# Patient Record
Sex: Female | Born: 1945 | Race: White | Hispanic: No | Marital: Single | Smoking: Never smoker
Health system: Southern US, Community
[De-identification: ages and names within clinical notes are randomized; demographics above are authoritative.]

## PROBLEM LIST (undated history)

## (undated) DIAGNOSIS — F32A Depression, unspecified: Secondary | ICD-10-CM

## (undated) DIAGNOSIS — H7190 Unspecified cholesteatoma, unspecified ear: Secondary | ICD-10-CM

## (undated) DIAGNOSIS — E669 Obesity, unspecified: Secondary | ICD-10-CM

## (undated) DIAGNOSIS — K2270 Barrett's esophagus without dysplasia: Secondary | ICD-10-CM

## (undated) DIAGNOSIS — T7840XA Allergy, unspecified, initial encounter: Secondary | ICD-10-CM

## (undated) DIAGNOSIS — K219 Gastro-esophageal reflux disease without esophagitis: Secondary | ICD-10-CM

## (undated) DIAGNOSIS — I10 Essential (primary) hypertension: Secondary | ICD-10-CM

## (undated) DIAGNOSIS — F909 Attention-deficit hyperactivity disorder, unspecified type: Secondary | ICD-10-CM

## (undated) DIAGNOSIS — F329 Major depressive disorder, single episode, unspecified: Secondary | ICD-10-CM

## (undated) DIAGNOSIS — E039 Hypothyroidism, unspecified: Secondary | ICD-10-CM

## (undated) HISTORY — DX: Major depressive disorder, single episode, unspecified: F32.9

## (undated) HISTORY — DX: Attention-deficit hyperactivity disorder, unspecified type: F90.9

## (undated) HISTORY — DX: Gastro-esophageal reflux disease without esophagitis: K21.9

## (undated) HISTORY — DX: Allergy, unspecified, initial encounter: T78.40XA

## (undated) HISTORY — PX: WRIST SURGERY: SHX841

## (undated) HISTORY — DX: Depression, unspecified: F32.A

## (undated) HISTORY — DX: Hypothyroidism, unspecified: E03.9

## (undated) HISTORY — DX: Essential (primary) hypertension: I10

## (undated) HISTORY — DX: Obesity, unspecified: E66.9

## (undated) HISTORY — DX: Barrett's esophagus without dysplasia: K22.70

## (undated) HISTORY — DX: Unspecified cholesteatoma, unspecified ear: H71.90

---

## 1988-06-27 HISTORY — PX: TYMPANIC MEMBRANE REPAIR: SHX294

## 1999-02-17 ENCOUNTER — Other Ambulatory Visit: Admission: RE | Admit: 1999-02-17 | Discharge: 1999-02-17 | Payer: Self-pay | Admitting: Obstetrics and Gynecology

## 1999-06-10 ENCOUNTER — Encounter: Admission: RE | Admit: 1999-06-10 | Discharge: 1999-06-10 | Payer: Self-pay | Admitting: Obstetrics and Gynecology

## 1999-06-10 ENCOUNTER — Encounter: Payer: Self-pay | Admitting: Obstetrics and Gynecology

## 2000-02-23 ENCOUNTER — Other Ambulatory Visit: Admission: RE | Admit: 2000-02-23 | Discharge: 2000-02-23 | Payer: Self-pay | Admitting: Obstetrics and Gynecology

## 2000-08-15 ENCOUNTER — Encounter: Payer: Self-pay | Admitting: Obstetrics and Gynecology

## 2000-08-15 ENCOUNTER — Encounter: Admission: RE | Admit: 2000-08-15 | Discharge: 2000-08-15 | Payer: Self-pay | Admitting: Obstetrics and Gynecology

## 2001-01-04 ENCOUNTER — Encounter: Admission: RE | Admit: 2001-01-04 | Discharge: 2001-01-04 | Payer: Self-pay | Admitting: Internal Medicine

## 2001-01-04 ENCOUNTER — Encounter: Payer: Self-pay | Admitting: Internal Medicine

## 2001-02-23 ENCOUNTER — Other Ambulatory Visit: Admission: RE | Admit: 2001-02-23 | Discharge: 2001-02-23 | Payer: Self-pay | Admitting: Internal Medicine

## 2001-05-30 ENCOUNTER — Encounter: Admission: RE | Admit: 2001-05-30 | Discharge: 2001-05-30 | Payer: Self-pay | Admitting: Internal Medicine

## 2001-05-30 ENCOUNTER — Encounter: Payer: Self-pay | Admitting: Internal Medicine

## 2002-03-18 ENCOUNTER — Encounter: Admission: RE | Admit: 2002-03-18 | Discharge: 2002-03-18 | Payer: Self-pay | Admitting: Obstetrics and Gynecology

## 2002-03-18 ENCOUNTER — Encounter: Payer: Self-pay | Admitting: Obstetrics and Gynecology

## 2003-05-08 ENCOUNTER — Ambulatory Visit (HOSPITAL_COMMUNITY): Admission: RE | Admit: 2003-05-08 | Discharge: 2003-05-08 | Payer: Self-pay | Admitting: Orthopedic Surgery

## 2003-05-08 ENCOUNTER — Ambulatory Visit (HOSPITAL_BASED_OUTPATIENT_CLINIC_OR_DEPARTMENT_OTHER): Admission: RE | Admit: 2003-05-08 | Discharge: 2003-05-08 | Payer: Self-pay | Admitting: Orthopedic Surgery

## 2004-01-02 ENCOUNTER — Encounter: Admission: RE | Admit: 2004-01-02 | Discharge: 2004-01-02 | Payer: Self-pay | Admitting: Obstetrics and Gynecology

## 2007-08-07 ENCOUNTER — Encounter: Admission: RE | Admit: 2007-08-07 | Discharge: 2007-08-07 | Payer: Self-pay | Admitting: Family Medicine

## 2007-08-24 ENCOUNTER — Encounter: Admission: RE | Admit: 2007-08-24 | Discharge: 2007-08-24 | Payer: Self-pay | Admitting: Family Medicine

## 2009-08-18 ENCOUNTER — Encounter: Admission: RE | Admit: 2009-08-18 | Discharge: 2009-08-18 | Payer: Self-pay | Admitting: Internal Medicine

## 2010-11-12 NOTE — Op Note (Signed)
NAME:  Christina Hays, Christina Hays                       ACCOUNT NO.:  [id number]   MEDICAL RECORD NO.:  [id number]                   PATIENT TYPE:  AMB   LOCATION:  DSC                                  FACILITY:  [facility]   PHYSICIAN:  Katy Fitch. Naaman Plummer., M.D.          DATE OF BIRTH:  [birth year]   DATE OF PROCEDURE:  05/08/2003  DATE OF DISCHARGE:                                 OPERATIVE REPORT   PREOPERATIVE DIAGNOSIS:  Enlarging mass, left long finger A2 pulley  consistent with a probable ganglion cyst.   POSTOPERATIVE DIAGNOSIS:  Enlarging mass, left long finger A2 pulley  consistent with a probable ganglion cyst.   PROCEDURE:  Excision of a large 12.0 x 11.0 mm ganglion from palmar surface  of A1/A2 pulley junction, left long finger.   SURGEON:  Katy Fitch. Sypher, M.D.   ASSISTANT:  Jonni Sanger, P.A.   ANESTHESIA:  2% lidocaine metacarpal head level block supplemented by IV  sedation.   SUPERVISING ANESTHESIOLOGIST:  Sheldon Silvan, M.D.   INDICATIONS FOR PROCEDURE:  This is a well established patient who presented  for evaluation of a painful lump in her left palm overlying the A2 pulley of  the long finger.  Clinical examination suggested a flexor sheath ganglion.  This was causing her mechanical symptoms.  She requested resection.   PROCEDURE:  The patient was brought to the operating room and placed in the  supine position on the operating table.  Following light sedation, the left  arm was prepped with Betadine soap and solution and sterilely draped.  A  pneumatic tourniquet was applied to the proximal forearm.  Following  exsanguination of the limb with an Esmarch bandage, the arterial tourniquet  was inflated to 250 mmHg.  2% lidocaine was then infiltrated into the path  of the intended incision and flexor sheath. When anesthesia was  satisfactory, a short oblique incision was fashioned directly over the mass.  Careful dissection protected the neurovascular bundles.   Blunt retractors  were placed revealing a large ganglion.  This was drained of its contents  and excised off the sheath.  The origin of the cyst appeared to be the  junction between the A1 and A2 pulleys. The synovium at this level was  debrided and electrocauterized.   The wound was then repaired with interrupted sutures of 5-0 nylon.   A compressive dressing was applied with Xeroflo, sterile gauze and an ace  wrap.   There were no apparent complications.   For after care, the patient will return to our office for follow-up in a  week for suture removal.   She is given a prescription for Darvocet-N 100 one or two tablets p.o. q.4  to 6h p.r.n. pain, twenty tablets without refill.   She will return sooner p.r.n. problems.  Katy Fitch Naaman Plummer., M.D.    RVS/MEDQ  D:  05/08/2003  T:  05/08/2003  Job:  [id number]   cc:   Erskine Speed, M.D.  [address]  [place]  [place] [address]  Fax: [phone]

## 2011-01-10 ENCOUNTER — Other Ambulatory Visit: Payer: Self-pay | Admitting: Gastroenterology

## 2011-01-11 ENCOUNTER — Ambulatory Visit
Admission: RE | Admit: 2011-01-11 | Discharge: 2011-01-11 | Disposition: A | Discharge status: Home / Self Care | Payer: BC Managed Care – PPO | Source: Ambulatory Visit | Attending: Gastroenterology | Admitting: Gastroenterology

## 2011-01-26 HISTORY — PX: MIDDLE EAR SURGERY: SHX713

## 2011-06-01 ENCOUNTER — Encounter (INDEPENDENT_AMBULATORY_CARE_PROVIDER_SITE_OTHER): Payer: BC Managed Care – PPO | Admitting: Internal Medicine

## 2011-06-01 DIAGNOSIS — R609 Edema, unspecified: Secondary | ICD-10-CM

## 2011-06-01 DIAGNOSIS — Z0000 Encounter for general adult medical examination without abnormal findings: Secondary | ICD-10-CM

## 2011-06-01 DIAGNOSIS — E7800 Pure hypercholesterolemia, unspecified: Secondary | ICD-10-CM

## 2011-06-24 ENCOUNTER — Other Ambulatory Visit: Payer: Self-pay | Admitting: Internal Medicine

## 2011-06-24 DIAGNOSIS — E042 Nontoxic multinodular goiter: Secondary | ICD-10-CM

## 2011-06-27 ENCOUNTER — Other Ambulatory Visit: Payer: Self-pay | Admitting: Internal Medicine

## 2011-06-27 DIAGNOSIS — E042 Nontoxic multinodular goiter: Secondary | ICD-10-CM

## 2011-06-29 ENCOUNTER — Other Ambulatory Visit (HOSPITAL_COMMUNITY): Payer: BC Managed Care – PPO

## 2011-06-30 ENCOUNTER — Ambulatory Visit
Admission: RE | Admit: 2011-06-30 | Discharge: 2011-06-30 | Disposition: A | Discharge status: Home / Self Care | Payer: BC Managed Care – PPO | Source: Ambulatory Visit | Attending: Internal Medicine | Admitting: Internal Medicine

## 2011-06-30 DIAGNOSIS — E042 Nontoxic multinodular goiter: Secondary | ICD-10-CM

## 2011-07-27 ENCOUNTER — Encounter: Payer: Self-pay | Admitting: Internal Medicine

## 2011-07-27 ENCOUNTER — Ambulatory Visit (INDEPENDENT_AMBULATORY_CARE_PROVIDER_SITE_OTHER): Payer: BC Managed Care – PPO | Admitting: Internal Medicine

## 2011-07-27 DIAGNOSIS — R1115 Cyclical vomiting syndrome unrelated to migraine: Secondary | ICD-10-CM

## 2011-07-27 DIAGNOSIS — F909 Attention-deficit hyperactivity disorder, unspecified type: Secondary | ICD-10-CM

## 2011-07-27 DIAGNOSIS — F411 Generalized anxiety disorder: Secondary | ICD-10-CM

## 2011-07-27 DIAGNOSIS — E039 Hypothyroidism, unspecified: Secondary | ICD-10-CM

## 2011-07-27 DIAGNOSIS — I10 Essential (primary) hypertension: Secondary | ICD-10-CM

## 2011-07-27 DIAGNOSIS — R4589 Other symptoms and signs involving emotional state: Secondary | ICD-10-CM

## 2011-07-27 LAB — CBC WITH DIFFERENTIAL/PLATELET
Basophils Absolute: 0.1 10*3/uL (ref 0.0–0.1)
Basophils Relative: 1 % (ref 0–1)
Eosinophils Absolute: 0.3 10*3/uL (ref 0.0–0.7)
Hemoglobin: 13.8 g/dL (ref 12.0–15.0)
MCHC: 34 g/dL (ref 30.0–36.0)
Monocytes Relative: 6 % (ref 3–12)
Neutro Abs: 5.5 10*3/uL (ref 1.7–7.7)
Neutrophils Relative %: 64 % (ref 43–77)
Platelets: 330 10*3/uL (ref 150–400)

## 2011-07-27 NOTE — Progress Notes (Signed)
Subjective:    Patient ID: Christina Hays, female    DOB: 06/13/1946, 66 y.o.   MRN: [id number]  Hypertension This is a chronic problem. The current episode started more than 1 year ago. The problem is unchanged. The problem is controlled. Associated symptoms include anxiety and peripheral edema. Pertinent negatives include no chest pain, palpitations or shortness of breath. Agents associated with hypertension include amphetamines. Risk factors for coronary artery disease include dyslipidemia, obesity, post-menopausal state, sedentary lifestyle and stress. The current treatment provides moderate improvement. Compliance problems include diet and exercise.  none. Identifiable causes of hypertension include sleep apnea.  Thyroid Problem Symptoms include anxiety, diarrhea, leg swelling and weight gain. Patient reports no cold intolerance, depressed mood, fatigue, heat intolerance, palpitations or weight loss. The symptoms have been stable.  Emesis  This is a recurrent problem. The current episode started more than 1 year ago. The problem occurs intermittently. The problem has been gradually worsening. The emesis has an appearance of stomach contents. There has been no fever. Associated symptoms include abdominal pain, chills and diarrhea. Pertinent negatives include no chest pain, coughing, dizziness or weight loss. She has tried diet change for the symptoms. The treatment provided no relief.   In January of 2011 she had her first episodes. These occur at random intervals without known precipitating causes. She begins with burping and progresses to distention with vomiting and occasionally diarrhea. The episodes last for 4-8 hours and has been occurring 3-4 times a month in recent months. She was evaluated by Dr. Evette Cristal with a normal abdominal ultrasound. She had to put off colonoscopy and endoscopy for her recent ear surgery. She is unhappy dealing with the [facility] office staff and wants to change practices.  Her last colonoscopy was by Dr. Arlyce Dice and she wishes to return to him.  She had a multinodular goiter in 2002 which was biopsied and felt to be normal. Her recent ultrasound revealed that the dominant nodule had not changed in size or consistency, and it is felt safe to follow at this point. She continues to be hypothyroid and that will be monitored.  She continues to take Adderall for her attention deficit disorder. This is not exacerbating her hypertension. It provides her with good support at work without side effects.   Review of Systems  Constitutional: Positive for chills and weight gain. Negative for weight loss and fatigue.  Respiratory: Negative for cough and shortness of breath.   Cardiovascular: Negative for chest pain and palpitations.  Gastrointestinal: Positive for abdominal pain and diarrhea.  Neurological: Negative for dizziness.  Hematological: Negative for cold intolerance and heat intolerance.       Objective:   Physical Exam  Constitutional: She is oriented to person, place, and time. She appears well-developed.  Eyes: Pupils are equal, round, and reactive to light.  Neck: Normal range of motion. Neck supple. No thyromegaly present.  Cardiovascular: Normal rate, regular rhythm and normal heart sounds.   Abdominal: She exhibits no mass. There is no tenderness.  Musculoskeletal: She exhibits edema.  Lymphadenopathy:    She has no cervical adenopathy.  Neurological: She is alert and oriented to person, place, and time.          Assessment & Plan:  Problem #1 cyclic vomiting syndrome. This needs to be completely evaluated by GI. Family medical leave act papers were filled out today to prevent her from running into trouble at work. There no further medicines to try at this point. She will be referred to  Dr. Arlyce Dice  Problem #2 hypertension-no change  Problem #3 hypothyroid-lab rechecked today  Problem #4 ADD-meds refilled today or as needed when she runs  out.  Problem #5 obesity-her lipid profile will be rechecked today and she is can encouraged to continue losing weight.  Problem #6 sleep apnea-this is no longer a problem and she has corrected some lifestyle issues.  Problem #7 anxiety-she has this as an after thought today because she is beginning to experience a lot of stress at work. She is even had one panic attack in the last month which she handles by deep breathing. This has not yet affecting her sleep or her social life. For the most part she enjoys her work.  Problem #8 hearing loss-as noted in her last office visit her recent surgery by Dr. Joelene Millin on the left ear gave her a 40 dB improvement. She is very happy with his outcome.  Her outstanding issues include no GYN exam in 10 years (Dr. Oletha Blend) Her history of migraines has not been active for many years.

## 2011-07-28 LAB — T4, FREE: Free T4: 1.5 ng/dL (ref 0.80–1.80)

## 2011-07-28 LAB — COMPREHENSIVE METABOLIC PANEL
ALT: 20 U/L (ref 0–35)
AST: 21 U/L (ref 0–37)
Alkaline Phosphatase: 89 U/L (ref 39–117)
BUN: 11 mg/dL (ref 6–23)
Chloride: 100 mEq/L (ref 96–112)
Creat: 0.79 mg/dL (ref 0.50–1.10)
Total Bilirubin: 0.6 mg/dL (ref 0.3–1.2)

## 2011-07-28 LAB — LIPID PANEL
HDL: 64 mg/dL (ref >39)
LDL Cholesterol: 130 mg/dL — ABNORMAL HIGH (ref 0–99)
Total CHOL/HDL Ratio: 3.3 Ratio
VLDL: 17 mg/dL (ref 0–40)

## 2011-07-28 LAB — TSH: TSH: 3.364 u[IU]/mL (ref 0.350–4.500)

## 2011-08-01 ENCOUNTER — Encounter: Payer: Self-pay | Admitting: Internal Medicine

## 2011-09-02 ENCOUNTER — Encounter: Payer: Self-pay | Admitting: Gastroenterology

## 2011-09-04 ENCOUNTER — Other Ambulatory Visit: Payer: Self-pay | Admitting: Internal Medicine

## 2011-09-11 ENCOUNTER — Ambulatory Visit (INDEPENDENT_AMBULATORY_CARE_PROVIDER_SITE_OTHER): Payer: BC Managed Care – PPO | Admitting: Emergency Medicine

## 2011-09-11 VITALS — BP 128/77 | HR 82 | Temp 98.6°F | Resp 16 | Ht 63.0 in | Wt 253.0 lb

## 2011-09-11 DIAGNOSIS — M7090 Unspecified soft tissue disorder related to use, overuse and pressure of unspecified site: Secondary | ICD-10-CM

## 2011-09-11 DIAGNOSIS — I10 Essential (primary) hypertension: Secondary | ICD-10-CM

## 2011-09-11 DIAGNOSIS — F988 Other specified behavioral and emotional disorders with onset usually occurring in childhood and adolescence: Secondary | ICD-10-CM

## 2011-09-11 MED ORDER — HYDROCHLOROTHIAZIDE 25 MG PO TABS: 25.0000 mg | ORAL_TABLET | Freq: Every day | ORAL | Status: DC

## 2011-09-11 MED ORDER — AMPHETAMINE-DEXTROAMPHETAMINE 20 MG PO TABS: 20.0000 mg | ORAL_TABLET | Freq: Two times a day (BID) | ORAL | Status: DC

## 2011-09-11 MED ORDER — LOSARTAN POTASSIUM 50 MG PO TABS: 50.0000 mg | ORAL_TABLET | Freq: Every day | ORAL | Status: DC

## 2011-09-11 NOTE — Progress Notes (Signed)
  Subjective:    Patient ID: Christina Hays, female    DOB: 1945/10/05, 66 y.o.   MRN: [id number]  HPI patient presents for refill on all her medications. She saw Dr. Merla Riches the end of January and had a complete blood panel as well as thyroid tests lipid profile and cmet. She feels well. She also is on Adderall twice a day that usually just takes it once a day in the morning, she is a long drive..    Review of Systems specifically she denies any chest pain shortness of breath. She feels fine she's been trying to be active and stay on her diet.     Objective:   Physical Exam  Constitutional: She appears well-developed.  HENT:  Head: Normocephalic.  Eyes: Pupils are equal, round, and reactive to light.  Neck: No JVD present. No tracheal deviation present. No thyromegaly present.  Cardiovascular: Normal rate.  Exam reveals no gallop and no friction rub.   No murmur heard. Pulmonary/Chest: No respiratory distress. She has no wheezes. She has no rales.  Lymphadenopathy:    She has no cervical adenopathy.          Assessment & Plan:  Assessment is hypertension and thyroid disease. She also has ADD on medication to

## 2011-09-20 ENCOUNTER — Encounter: Payer: Self-pay | Admitting: Internal Medicine

## 2011-09-21 ENCOUNTER — Encounter: Payer: Self-pay | Admitting: Gastroenterology

## 2011-09-21 ENCOUNTER — Ambulatory Visit (INDEPENDENT_AMBULATORY_CARE_PROVIDER_SITE_OTHER): Payer: BC Managed Care – PPO | Admitting: Gastroenterology

## 2011-09-21 ENCOUNTER — Other Ambulatory Visit (INDEPENDENT_AMBULATORY_CARE_PROVIDER_SITE_OTHER): Payer: BC Managed Care – PPO

## 2011-09-21 DIAGNOSIS — R112 Nausea with vomiting, unspecified: Secondary | ICD-10-CM

## 2011-09-21 DIAGNOSIS — R109 Unspecified abdominal pain: Secondary | ICD-10-CM

## 2011-09-21 DIAGNOSIS — R1115 Cyclical vomiting syndrome unrelated to migraine: Secondary | ICD-10-CM

## 2011-09-21 LAB — BASIC METABOLIC PANEL
Calcium: 9.2 mg/dL (ref 8.4–10.5)
GFR: 73.29 mL/min (ref >60.00)
Potassium: 4 mEq/L (ref 3.5–5.1)
Sodium: 140 mEq/L (ref 135–145)

## 2011-09-21 NOTE — Assessment & Plan Note (Addendum)
There is no clear etiology for his symptoms which include diarrhea. Partial, intermittent bowel obstruction is a possibility. There is no pattern with food or drink intake to suggest a food allergy. Gastroparesis, ulcer and non ulcer dyspepsia are less likely.  Recommendations #1 CT of the abdomen and pelvis #2 upper endoscopy if #1 is negative. #3 gastric imaging scan pending results of above #4 to consider hyomax sublingual with the onset of symptoms #4 stat evaluation while patient is symptomatic

## 2011-09-21 NOTE — Progress Notes (Signed)
History of Present Illness:  Christina Hays is a 66 year old white female referred at the request of Dr. Merla Riches for evaluation of episodic abdominal pain, vomiting and diarrhea. For the past 2 years she suffered from episodes as described above. She initially   was awakened at night with diffuse pain across her mid abdomen. This would be followed by nausea, vomiting and protracted diarrhea. Episodes would last 2-3 hours at a time. She initially had 3 episodes in 5 weeks. Over the next 2 years she's had about 5 episodes a year. In between she feels perfectly well. There is no relation to any specific foods. She now tends to get these episodes during the day. She vomits clear liquids rather than undigested foods. Symptoms are preceded by excess foul-smelling belches. There's been no change in her medications or diet.    Past Medical History  Diagnosis Date  . Hypertension   . Hypothyroidism   . ADD (attention deficit disorder with hyperactivity)   . Hearing loss   . Obesity   . Depression   . Migraine   . Cholesteatoma of ear    Past Surgical History  Procedure Date  . Tympanic membrane repair 1990    tighting  . Middle ear surgery 01/2011    replaced missing malleus   family history includes Colon cancer in her maternal grandmother; Colon polyps in her mother; Emphysema in her father and mother; and Liver cancer in her maternal grandfather. Current Outpatient Prescriptions  Medication Sig Dispense Refill  . amphetamine-dextroamphetamine (ADDERALL) 20 MG tablet Take 1 tablet (20 mg total) by mouth 2 (two) times daily.  60 tablet  0  . Cholecalciferol (VITAMIN D3) 2000 UNITS capsule Take 6,000 Units by mouth daily.      . hydrochlorothiazide (HYDRODIURIL) 25 MG tablet Take 1 tablet (25 mg total) by mouth daily.  30 tablet  11  . levothyroxine (SYNTHROID, LEVOTHROID) 88 MCG tablet Take 88 mcg by mouth daily.       Marland Kitchen losartan (COZAAR) 50 MG tablet Take 1 tablet (50 mg total) by mouth daily.   30 tablet  11  . Multiple Vitamins-Minerals (EQL PROTECTAVISION) CAPS Take 1 tablet by mouth daily.      . Omega-3 Fatty Acids 300 MG CAPS Take 1 capsule by mouth daily.      . vitamin B-12 (CYANOCOBALAMIN) 1000 MCG tablet Take 1,000 mcg by mouth daily.       Allergies as of 09/21/2011 - Review Complete 09/21/2011  Allergen Reaction Noted  . Celexa  07/27/2011  . Codeine  07/27/2011  . Penicillins  07/27/2011    reports that she has never smoked. She has never used smokeless tobacco. She reports that she drinks alcohol. She reports that she does not use illicit drugs.     Review of Systems: She's had some loss of hearing which is improved since undergoing ear surgery. She complains of frequent lower extremity edema. Pertinent positive and negative review of systems were noted in the above HPI section. All other review of systems were otherwise negative.  Vital signs were reviewed in today's medical record Physical Exam: General: Well developed , well nourished, no acute distress Head: Normocephalic and atraumatic Eyes:  sclerae anicteric, EOMI Ears: Normal auditory acuity Mouth: No deformity or lesions Neck: Supple, no masses or thyromegaly Lungs: Clear throughout to auscultation Heart: Regular rate and rhythm; no murmurs, rubs or bruits Abdomen: Soft, non tender and non distended. No masses, hepatosplenomegaly or hernias noted. Normal Bowel sounds; there is no  succussion splash Rectal:deferred Musculoskeletal: Symmetrical with no gross deformities  Skin: No lesions on visible extremities Pulses:  Normal pulses noted Extremities: No clubbing, cyanosis, or deformities noted; there is bilateral pedal edema, left greater than right Neurological: Alert oriented x 4, grossly nonfocal Cervical Nodes:  No significant cervical adenopathy Inguinal Nodes: No significant inguinal adenopathy Psychological:  Alert and cooperative. Normal mood and affect

## 2011-09-21 NOTE — Patient Instructions (Signed)
  You have been scheduled for a CT scan of the abdomen and pelvis at [facility] ([address]---this is in the same building as [facility]).   You are scheduled on 09/26/2011 at 11am. You should arrive 15 minutes prior to your appointment time for registration. Please follow the written instructions below on the day of your exam:  WARNING: IF YOU ARE ALLERGIC TO IODINE/X-RAY DYE, PLEASE NOTIFY RADIOLOGY IMMEDIATELY AT [phone]! YOU WILL BE GIVEN A 13 HOUR PREMEDICATION PREP.  1) Do not eat or drink anything after 7am (4 hours prior to your test) 2) You have been given 2 bottles of oral contrast to drink. The solution may taste               better if refrigerated, but do NOT add ice or any other liquid to this solution. Shake             well before drinking.    Drink 1 bottle of contrast @ 9am (2 hours prior to your exam)  Drink 1 bottle of contrast @ 10am (1 hour prior to your exam)  You may take any medications as prescribed with a small amount of water except for the following: Metformin, Glucophage, Glucovance, Avandamet, Riomet, Fortamet, Actoplus Met, Janumet, Glumetza or Metaglip. The above medications must be held the day of the exam AND 48 hours after the exam.  The purpose of you drinking the oral contrast is to aid in the visualization of your intestinal tract. The contrast solution may cause some diarrhea. Before your exam is started, you will be given a small amount of fluid to drink. Depending on your individual set of symptoms, you may also receive an intravenous injection of x-ray contrast/dye. Plan on being at [facility] for 30 minutes or long, depending on the type of exam you are having performed.  If you have any questions regarding your exam or if you need to reschedule, you may call the CT department at [phone] between the hours of 8:00 am and 5:00 pm, Monday-Friday.   GO TO THE BASEMENT FOR LABS  TODAY ________________________________________________________________________

## 2011-09-26 ENCOUNTER — Other Ambulatory Visit: Payer: BC Managed Care – PPO

## 2011-09-29 ENCOUNTER — Ambulatory Visit (INDEPENDENT_AMBULATORY_CARE_PROVIDER_SITE_OTHER)
Admission: RE | Admit: 2011-09-29 | Discharge: 2011-09-29 | Disposition: A | Discharge status: Home / Self Care | Payer: BC Managed Care – PPO | Source: Ambulatory Visit | Attending: Gastroenterology | Admitting: Gastroenterology

## 2011-09-29 DIAGNOSIS — R112 Nausea with vomiting, unspecified: Secondary | ICD-10-CM

## 2011-09-29 DIAGNOSIS — R109 Unspecified abdominal pain: Secondary | ICD-10-CM

## 2011-09-29 MED ORDER — IOHEXOL 300 MG/ML  SOLN
80.0000 mL | Freq: Once | INTRAMUSCULAR | Status: AC | PRN
  Administered 2011-09-29: 80 mL via INTRAVENOUS

## 2011-10-13 ENCOUNTER — Ambulatory Visit (AMBULATORY_SURGERY_CENTER): Payer: BC Managed Care – PPO | Admitting: *Deleted

## 2011-10-13 VITALS — Ht 62.0 in | Wt 257.8 lb

## 2011-10-13 DIAGNOSIS — R1115 Cyclical vomiting syndrome unrelated to migraine: Secondary | ICD-10-CM

## 2011-10-13 DIAGNOSIS — R109 Unspecified abdominal pain: Secondary | ICD-10-CM

## 2011-10-13 DIAGNOSIS — R112 Nausea with vomiting, unspecified: Secondary | ICD-10-CM

## 2011-10-14 ENCOUNTER — Encounter: Payer: Self-pay | Admitting: Gastroenterology

## 2011-10-20 ENCOUNTER — Telehealth: Payer: Self-pay | Admitting: Gastroenterology

## 2011-10-20 NOTE — Telephone Encounter (Signed)
Patient wants to know if she has to remove her acrylic overlay nails prior to EGD. Per Olegario Messier in previsit- no, just no red or dark polish. Patient notified

## 2011-10-20 NOTE — Telephone Encounter (Signed)
Patient wants Dr. Arlyce Dice to know that she has met her insurance deductible until June 1st then it will start over. She states if she needs anything after the EGD on 10/25/11, she would like it to be done prior to June 1st so she will not have to meet her deductible again.

## 2011-10-20 NOTE — Telephone Encounter (Signed)
ok 

## 2011-10-25 ENCOUNTER — Telehealth: Payer: Self-pay | Admitting: Gastroenterology

## 2011-10-25 ENCOUNTER — Encounter: Payer: BC Managed Care – PPO | Admitting: Gastroenterology

## 2011-10-25 NOTE — Telephone Encounter (Signed)
What was she scheduled for?

## 2011-10-25 NOTE — Telephone Encounter (Signed)
Pt was schedule for an EGD this afternoon

## 2011-10-25 NOTE — Telephone Encounter (Signed)
The cough is not a contraindication for having an EGD and I would recommend she have it done.  If she cancels I would not assess a penalty.

## 2011-11-08 ENCOUNTER — Ambulatory Visit (AMBULATORY_SURGERY_CENTER): Payer: BC Managed Care – PPO | Admitting: Gastroenterology

## 2011-11-08 ENCOUNTER — Encounter: Payer: Self-pay | Admitting: Gastroenterology

## 2011-11-08 ENCOUNTER — Other Ambulatory Visit: Payer: Self-pay | Admitting: Gastroenterology

## 2011-11-08 VITALS — BP 146/82 | HR 90 | Temp 96.5°F | Resp 14 | Ht 62.0 in | Wt 257.0 lb

## 2011-11-08 DIAGNOSIS — R109 Unspecified abdominal pain: Secondary | ICD-10-CM

## 2011-11-08 DIAGNOSIS — K2270 Barrett's esophagus without dysplasia: Secondary | ICD-10-CM

## 2011-11-08 DIAGNOSIS — R1115 Cyclical vomiting syndrome unrelated to migraine: Secondary | ICD-10-CM

## 2011-11-08 DIAGNOSIS — R112 Nausea with vomiting, unspecified: Secondary | ICD-10-CM

## 2011-11-08 MED ORDER — PANTOPRAZOLE SODIUM 40 MG PO TBEC: 40.0000 mg | DELAYED_RELEASE_TABLET | Freq: Two times a day (BID) | ORAL | Status: DC

## 2011-11-08 MED ORDER — SODIUM CHLORIDE 0.9 % IV SOLN: 500.0000 mL | INTRAVENOUS | Status: DC

## 2011-11-08 NOTE — Op Note (Signed)
[facility] [address] [place], [place]  [address]  ENDOSCOPY PROCEDURE REPORT  PATIENT:  Christina, Hays  MR#:  [id number] BIRTHDATE:  [birth year], 66 yrs. old  GENDER:  female  ENDOSCOPIST:  Barbette Hair. Arlyce Dice, MD Referred by:  Ellamae Sia, M.D.  PROCEDURE DATE:  11/08/2011 PROCEDURE:  EGD with biopsy, [id number] ASA CLASS:  Class II INDICATIONS:  nausea and vomiting  MEDICATIONS:   MAC sedation, administered by CRNA propofol 100mg IV, glycopyrrolate (Robinal) 0.2 mg IV, 0.6cc simethancone 0.6 cc PO TOPICAL ANESTHETIC:  DESCRIPTION OF PROCEDURE:   After the risks and benefits of the procedure were explained, informed consent was obtained.  The LB GIF-H180 [id number] endoscope was introduced through the mouth and advanced to the third portion of the duodenum.  The instrument was slowly withdrawn as the mucosa was fully examined. <<PROCEDUREIMAGES>>  Esophagitis was found in the mid esophagus. Erosive esophagitis with friable mucosa at GE junction (see image8).  irregular Z-line. Irregular Z line. Z line at 30cm. Bxs taken (see image7). A stricture was found at the gastroesophageal junction (see image4). Moderate stricture  A hiatal hernia was found. 3-4cm sliding hiatal hernia  Otherwise the examination was normal (see image2 and image3).    Retroflexed views revealed no abnormalities.    The scope was then withdrawn from the patient and the procedure completed.  COMPLICATIONS:  None  ENDOSCOPIC IMPRESSION: 1) Esophagitis in the mid esophagus 2) Irregular Z-line 3) Stricture at the gastroesophageal junction 4) Hiatal hernia 5) Otherwise normal examination  Findings do not explain symptoms of episodic nausea and vomiting  RECOMMENDATIONS:begin protonix 40mg  bid gastric emptying scan OV 3-4 weeks  ______________________________ Barbette Hair. Arlyce Dice, MD  CC:  n. eSIGNED:   Barbette Hair. Roshanda Balazs at 11/08/2011 01:55 PM  Shawna Orleans, [phone]

## 2011-11-08 NOTE — Patient Instructions (Signed)
Impressions/recommendations:  Esophagitis (handout given) Esophageal stricture (handout given) Hiatal Hernia (handout given)  Begin protonix 40 mg twice a day Gastric emptying scan (this will be scheduled by Dr. Marzetta Board office nurse) Office visit 3-4 weeks (this will be scheduled by Dr. Marzetta Board office nurse)  YOU HAD AN ENDOSCOPIC PROCEDURE TODAY AT THE [facility]: Refer to the procedure report that was given to you for any specific questions about what was found during the examination.  If the procedure report does not answer your questions, please call your gastroenterologist to clarify.  If you requested that your care partner not be given the details of your procedure findings, then the procedure report has been included in a sealed envelope for you to review at your convenience later.  YOU SHOULD EXPECT: Some feelings of bloating in the abdomen. Passage of more gas than usual.  Walking can help get rid of the air that was put into your GI tract during the procedure and reduce the bloating. If you had a lower endoscopy (such as a colonoscopy or flexible sigmoidoscopy) you may notice spotting of blood in your stool or on the toilet paper. If you underwent a bowel prep for your procedure, then you may not have a normal bowel movement for a few days.  DIET: Your first meal following the procedure should be a light meal and then it is ok to progress to your normal diet.  A half-sandwich or bowl of soup is an example of a good first meal.  Heavy or fried foods are harder to digest and may make you feel nauseous or bloated.  Likewise meals heavy in dairy and vegetables can cause extra gas to form and this can also increase the bloating.  Drink plenty of fluids but you should avoid alcoholic beverages for 24 hours.  ACTIVITY: Your care partner should take you home directly after the procedure.  You should plan to take it easy, moving slowly for the rest of the day.  You can resume normal  activity the day after the procedure however you should NOT DRIVE or use heavy machinery for 24 hours (because of the sedation medicines used during the test).    SYMPTOMS TO REPORT IMMEDIATELY: A gastroenterologist can be reached at any hour.  During normal business hours, 8:30 AM to 5:00 PM Monday through Friday, call [phone].  After hours and on weekends, please call the GI answering service at [phone] who will take a message and have the physician on call contact you.   Following lower endoscopy (colonoscopy or flexible sigmoidoscopy):  Excessive amounts of blood in the stool  Significant tenderness or worsening of abdominal pains  Swelling of the abdomen that is new, acute  Fever of 100F or higher  Following upper endoscopy (EGD)  Vomiting of blood or coffee ground material  New chest pain or pain under the shoulder blades  Painful or persistently difficult swallowing  New shortness of breath  Fever of 100F or higher  Black, tarry-looking stools  FOLLOW UP: If any biopsies were taken you will be contacted by phone or by letter within the next 1-3 weeks.  Call your gastroenterologist if you have not heard about the biopsies in 3 weeks.  Our staff will call the home number listed on your records the next business day following your procedure to check on you and address any questions or concerns that you may have at that time regarding the information given to you following your procedure. This is  a courtesy call and so if there is no answer at the home number and we have not heard from you through the emergency physician on call, we will assume that you have returned to your regular daily activities without incident.  SIGNATURES/CONFIDENTIALITY: You and/or your care partner have signed paperwork which will be entered into your electronic medical record.  These signatures attest to the fact that that the information above on your After Visit Summary has been reviewed and is  understood.  Full responsibility of the confidentiality of this discharge information lies with you and/or your care-partner.

## 2011-11-08 NOTE — Progress Notes (Signed)
Patient did not have preoperative order for IV antibiotic SSI prophylaxis. (G8918)  Patient did not experience any of the following events: a burn prior to discharge; a fall within the facility; wrong site/side/patient/procedure/implant event; or a hospital transfer or hospital admission upon discharge from the facility. (G8907)  

## 2011-11-08 NOTE — Progress Notes (Signed)
The pt tolerated the egd. Maw

## 2011-11-09 ENCOUNTER — Telehealth: Payer: Self-pay

## 2011-11-09 NOTE — Telephone Encounter (Signed)
Pt scheduled for GES at [facility] 11/22/11 arrival time 0745 for 8am. Pt to be NPO after midnight and hold protonix for 24 hours. Pt aware of appt date and time.

## 2011-11-09 NOTE — Telephone Encounter (Signed)
  Follow up Call-  Call back number 11/08/2011  Post procedure Call Back phone  # [phone]  Permission to leave phone message Yes     Patient questions:  Do you have a fever, pain , or abdominal swelling? no Pain Score  0 *  Have you tolerated food without any problems? yes  Have you been able to return to your normal activities? yes  Do you have any questions about your discharge instructions: Diet   no Medications  no Follow up visit  no  Do you have questions or concerns about your Care? no  Actions: * If pain score is 4 or above: No action needed, pain <4.

## 2011-11-22 ENCOUNTER — Encounter: Payer: Self-pay | Admitting: Gastroenterology

## 2011-11-22 ENCOUNTER — Encounter (HOSPITAL_COMMUNITY)
Admission: RE | Admit: 2011-11-22 | Discharge: 2011-11-22 | Disposition: A | Discharge status: Home / Self Care | Payer: BC Managed Care – PPO | Source: Ambulatory Visit | Attending: Gastroenterology | Admitting: Gastroenterology

## 2011-11-23 ENCOUNTER — Encounter (HOSPITAL_COMMUNITY): Payer: Self-pay

## 2011-11-23 ENCOUNTER — Encounter (HOSPITAL_COMMUNITY)
Admission: RE | Admit: 2011-11-23 | Discharge: 2011-11-23 | Disposition: A | Discharge status: Home / Self Care | Payer: BC Managed Care – PPO | Source: Ambulatory Visit | Attending: Gastroenterology | Admitting: Gastroenterology

## 2011-11-23 DIAGNOSIS — R109 Unspecified abdominal pain: Secondary | ICD-10-CM | POA: Insufficient documentation

## 2011-11-23 DIAGNOSIS — R6881 Early satiety: Secondary | ICD-10-CM | POA: Insufficient documentation

## 2011-11-23 DIAGNOSIS — R112 Nausea with vomiting, unspecified: Secondary | ICD-10-CM | POA: Insufficient documentation

## 2011-11-23 MED ORDER — TECHNETIUM TC 99M SULFUR COLLOID
2.2000 | Freq: Once | INTRAVENOUS | Status: AC | PRN
  Administered 2011-11-23: 2.2 via INTRAVENOUS

## 2011-11-23 NOTE — Progress Notes (Signed)
Quick Note:  Please inform the patient that the GES was normal. He needs an OV ______

## 2011-12-16 ENCOUNTER — Encounter: Payer: Self-pay | Admitting: Gastroenterology

## 2011-12-16 ENCOUNTER — Ambulatory Visit (INDEPENDENT_AMBULATORY_CARE_PROVIDER_SITE_OTHER): Payer: BC Managed Care – PPO | Admitting: Gastroenterology

## 2011-12-16 VITALS — BP 118/82 | HR 88 | Ht 62.0 in | Wt 252.0 lb

## 2011-12-16 DIAGNOSIS — R1115 Cyclical vomiting syndrome unrelated to migraine: Secondary | ICD-10-CM

## 2011-12-16 MED ORDER — PEG-KCL-NACL-NASULF-NA ASC-C 100 G PO SOLR: 1.0000 | Freq: Once | ORAL | Status: DC

## 2011-12-16 NOTE — Progress Notes (Signed)
History of Present Illness:  Christina Hays returns for followup of episodic nausea and vomiting. 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 Since her last visit she has had one episode of nausea with vomiting. Otherwise, she has felt well. She occasionally has had mild postprandial nausea. She denies abdominal pain.    Review of Systems: Pertinent positive and negative review of systems were noted in the above HPI section. All other review of systems were otherwise negative.    Current Medications, Allergies, Past Medical History, Past Surgical History, Family History and Social History were reviewed in [employer] electronic medical record  Vital signs were reviewed in today's medical record. Physical Exam: General: Well developed , well nourished, no acute distress

## 2011-12-16 NOTE — Patient Instructions (Addendum)
Colonoscopy A colonoscopy is an exam to evaluate your entire colon. In this exam, your colon is cleansed. A long fiberoptic tube is inserted through your rectum and into your colon. The fiberoptic scope (endoscope) is a long bundle of enclosed and very flexible fibers. These fibers transmit light to the area examined and send images from that area to your caregiver. Discomfort is usually minimal. You may be given a drug to help you sleep (sedative) during or prior to the procedure. This exam helps to detect lumps (tumors), polyps, inflammation, and areas of bleeding. Your caregiver may also take a small piece of tissue (biopsy) that will be examined under a microscope. LET YOUR CAREGIVER KNOW ABOUT:   Allergies to food or medicine.   Medicines taken, including vitamins, herbs, eyedrops, over-the-counter medicines, and creams.   Use of steroids (by mouth or creams).   Previous problems with anesthetics or numbing medicines.   History of bleeding problems or blood clots.   Previous surgery.   Other health problems, including diabetes and kidney problems.   Possibility of pregnancy, if this applies.  BEFORE THE PROCEDURE   A clear liquid diet may be required for 2 days before the exam.   Ask your caregiver about changing or stopping your regular medications.   Liquid injections (enemas) or laxatives may be required.   A large amount of electrolyte solution may be given to you to drink over a short period of time. This solution is used to clean out your colon.   You should be present 60 minutes prior to your procedure or as directed by your caregiver.  AFTER THE PROCEDURE   If you received a sedative or pain relieving medication, you will need to arrange for someone to drive you home.   Occasionally, there is a little blood passed with the first bowel movement. Do not be concerned.  FINDING OUT THE RESULTS OF YOUR TEST Not all test results are available during your visit. If your test  results are not back during the visit, make an appointment with your caregiver to find out the results. Do not assume everything is normal if you have not heard from your caregiver or the medical facility. It is important for you to follow up on all of your test results. HOME CARE INSTRUCTIONS   It is not unusual to pass moderate amounts of gas and experience mild abdominal cramping following the procedure. This is due to air being used to inflate your colon during the exam. Walking or a warm pack on your belly (abdomen) may help.   You may resume all normal meals and activities after sedatives and medicines have worn off.   Only take over-the-counter or prescription medicines for pain, discomfort, or fever as directed by your caregiver. Do not use aspirin or blood thinners if a biopsy was taken. Consult your caregiver for medicine usage if biopsies were taken.  SEEK IMMEDIATE MEDICAL CARE IF:   You have a fever.   You pass large blood clots or fill a toilet with blood following the procedure. This may also occur 10 to 14 days following the procedure. This is more likely if a biopsy was taken.   You develop abdominal pain that keeps getting worse and cannot be relieved with medicine.  Document Released: 06/10/2000 Document Revised: 06/02/2011 Document Reviewed: 01/24/2008 [employer] Patient Information 2012 [employer]. 

## 2011-12-16 NOTE — Assessment & Plan Note (Addendum)
Etiology is still unclear. She does not have dermographia that could suggest mast cell release.  Recommendations #1 colonoscopy #2 small bowel follow-through if #1 is negative

## 2011-12-24 ENCOUNTER — Ambulatory Visit (INDEPENDENT_AMBULATORY_CARE_PROVIDER_SITE_OTHER): Payer: BC Managed Care – PPO | Admitting: Internal Medicine

## 2011-12-24 VITALS — BP 108/71 | HR 90 | Temp 98.0°F | Resp 18 | Ht 62.0 in | Wt 254.2 lb

## 2011-12-24 DIAGNOSIS — H9209 Otalgia, unspecified ear: Secondary | ICD-10-CM

## 2011-12-24 DIAGNOSIS — F988 Other specified behavioral and emotional disorders with onset usually occurring in childhood and adolescence: Secondary | ICD-10-CM

## 2011-12-24 MED ORDER — AMPHETAMINE-DEXTROAMPHETAMINE 20 MG PO TABS: 20.0000 mg | ORAL_TABLET | Freq: Two times a day (BID) | ORAL | Status: DC

## 2011-12-24 MED ORDER — MOMETASONE FUROATE 50 MCG/ACT NA SUSP: 2.0000 | Freq: Every day | NASAL | Status: DC

## 2011-12-24 NOTE — Progress Notes (Signed)
  Subjective:    Patient ID: Christina Hays, female    DOB: 1946/02/01, 66 y.o.   MRN: [id number]  HPI Left Ear noise , "crackling", mild hearing loss, denies recent cold or congestion.  History of surgery on this year/she perceives a difference in feeling and usually understands her symptoms/no fever/she's not dizzy    Patient Active Problem List  Diagnosis  . Hypothyroid  . HTN (hypertension)-Control  . ADD (attention deficit disorder with hyperactivity)-meds working well, needs refill  . Cyclic vomiting syndrome-See notes from Dr. Arlyce Dice. Workup in progress. He does have Barrett's esophagitis      Review of SystemsNoncontributory     Objective:   Physical Exam Vital signs stable Left canal with slight wax/TM distorted from surgery/dull and/no definite fluid level Right TM and canal clear Nose clear/throat clear Hearing intact       Assessment & Plan:   1. ADD (attention deficit disorder) Prescriptions given for 3 months/90 repeat by authorization amphetamine-dextroamphetamine (ADDERALL) 20 MG tablet, Twice a day amphetamine-dextroamphetamine (ADDERALL) 20 MG tablet, Twice a day amphetamine-dextroamphetamine (ADDERALL) 20 MG tablet Twice a day  2. Otalgia  mometasone (NASONEX) 50 MCG/ACT nasal spray  As per her EENT she'll start with afrin to open the eustachian tube and will add Nasonex for the next month she will watch for progression to infection Next labs due in October or January regarding hypothyroidism and hypertension Considering followup Routine after 10 years colonoscopy when has Medicare

## 2011-12-27 ENCOUNTER — Telehealth: Payer: Self-pay | Admitting: *Deleted

## 2011-12-27 MED ORDER — FLUTICASONE PROPIONATE 50 MCG/ACT NA SUSP: 2.0000 | Freq: Every day | NASAL | Status: DC

## 2011-12-27 NOTE — Telephone Encounter (Signed)
Ins will not pay for Nasonex can we change to something else?

## 2011-12-27 NOTE — Telephone Encounter (Signed)
Flonase sent in

## 2011-12-27 NOTE — Telephone Encounter (Signed)
lmom that rx for flonase was sent in

## 2012-01-03 ENCOUNTER — Other Ambulatory Visit: Payer: Self-pay | Admitting: *Deleted

## 2012-01-03 NOTE — Telephone Encounter (Signed)
Sent refill through fax recieved

## 2012-01-26 ENCOUNTER — Encounter: Payer: BC Managed Care – PPO | Admitting: Gastroenterology

## 2012-05-12 ENCOUNTER — Other Ambulatory Visit: Payer: Self-pay | Admitting: Internal Medicine

## 2012-08-02 ENCOUNTER — Ambulatory Visit: Payer: BC Managed Care – PPO

## 2012-08-02 ENCOUNTER — Ambulatory Visit (INDEPENDENT_AMBULATORY_CARE_PROVIDER_SITE_OTHER): Payer: BC Managed Care – PPO | Admitting: Emergency Medicine

## 2012-08-02 VITALS — BP 128/84 | HR 98 | Temp 98.5°F | Resp 16 | Ht 62.0 in | Wt 245.0 lb

## 2012-08-02 DIAGNOSIS — M79642 Pain in left hand: Secondary | ICD-10-CM

## 2012-08-02 DIAGNOSIS — I10 Essential (primary) hypertension: Secondary | ICD-10-CM

## 2012-08-02 DIAGNOSIS — E039 Hypothyroidism, unspecified: Secondary | ICD-10-CM

## 2012-08-02 DIAGNOSIS — M79609 Pain in unspecified limb: Secondary | ICD-10-CM

## 2012-08-02 DIAGNOSIS — F988 Other specified behavioral and emotional disorders with onset usually occurring in childhood and adolescence: Secondary | ICD-10-CM

## 2012-08-02 LAB — POCT CBC
Granulocyte percent: 63.7 %G (ref 37–80)
Hemoglobin: 14 g/dL (ref 12.2–16.2)
MCH, POC: 29.6 pg (ref 27–31.2)
MCV: 90.5 fL (ref 80–97)
MPV: 9.6 fL (ref 0–99.8)
POC MID %: 7.5 %M (ref 0–12)
Platelet Count, POC: 335 10*3/uL (ref 142–424)
RBC: 4.73 M/uL (ref 4.04–5.48)
WBC: 7.6 10*3/uL (ref 4.6–10.2)

## 2012-08-02 LAB — COMPREHENSIVE METABOLIC PANEL
AST: 20 U/L (ref 0–37)
Albumin: 4.1 g/dL (ref 3.5–5.2)
Alkaline Phosphatase: 91 U/L (ref 39–117)
BUN: 11 mg/dL (ref 6–23)
Calcium: 9.8 mg/dL (ref 8.4–10.5)
Chloride: 100 mEq/L (ref 96–112)
Potassium: 3.5 mEq/L (ref 3.5–5.3)
Sodium: 138 mEq/L (ref 135–145)
Total Protein: 7.2 g/dL (ref 6.0–8.3)

## 2012-08-02 MED ORDER — LEVOTHYROXINE SODIUM 88 MCG PO TABS: 88.0000 ug | ORAL_TABLET | Freq: Every day | ORAL | Status: DC

## 2012-08-02 NOTE — Progress Notes (Signed)
  Subjective:    Patient ID: Christina Hays, female    DOB: 11-04-1945, 67 y.o.   MRN: [id number]  HPI Pt presents to clinic today for refills for thyroid medicine and L hand pain. In December, Pt noticed some stiffness and increased pain in L hand. She notes some definite swelling in her L ring finger. She states that it feels like it wants to "pop out of joint" she is status post carpal tunnel surgery on that hand and has a significant scar over the base of the hand volar surface her main discomfort is with flexion of the fourth finger and some of the middle finger. Pt is due for a colonoscopy but due to insurance reasons, (plans to sign up for Medicare) she is planning to schedule her colonoscopy and endoscopy in June 2014.  Pt states her Lt wrist carpal tunnel surgery was in 1987 or 1988. Patient states she takes her Adderall once or twice a week for her ADD. When last seen she was scheduled see the cardiologist to get clearance to take the medication however she says she was too busy dealing with her other medical problems to keep that appointment.  Review of Systems           Physical Exam HEENT exam TMs are clear nose is normal throat is clear neck is supple without thyromegaly. Chest is clear to auscultation and percussion. Cardiac exam reveals regular rate without murmur. Abdomen is obese without masses examination of the left hand reveals a scar over the carpal tunnel area. There is significant pain with flexion of the ring finger and middle finger against resistance he [facility] reading (PRIMARY) by  Dr. hand films show arthritic changes at the MCP and PIP joints         Assessment & Plan:  I told the patient she will have to have a cardiology clearance to continue to get Adderall. We'll check her baseline labs today films to be done of the left hand. Thyroid meds were refilled. She was told she could only take Tylenol because of all the discomfort she is having with her esophagus.

## 2012-08-02 NOTE — Patient Instructions (Signed)
Please take Tylenol for pain. Please wear your wrist splint at night .

## 2012-08-02 NOTE — Progress Notes (Deleted)
  Subjective:    Patient ID: Christina Hays, female    DOB: 03/31/1946, 67 y.o.   MRN: [id number]  HPI    Review of Systems     Objective:   Physical Exam        Assessment & Plan:

## 2012-08-03 ENCOUNTER — Encounter: Payer: Self-pay | Admitting: Cardiology

## 2012-08-03 ENCOUNTER — Telehealth: Payer: Self-pay

## 2012-08-03 ENCOUNTER — Encounter: Payer: Self-pay | Admitting: Emergency Medicine

## 2012-08-03 NOTE — Telephone Encounter (Signed)
Patient called no answer.Left message on personal voice mail needs to come 15 mins before appointment and will have to fill out paper work.Advised to bring a list of medication.

## 2012-08-11 ENCOUNTER — Other Ambulatory Visit: Payer: Self-pay

## 2012-08-13 ENCOUNTER — Ambulatory Visit (INDEPENDENT_AMBULATORY_CARE_PROVIDER_SITE_OTHER): Payer: BC Managed Care – PPO | Admitting: Cardiology

## 2012-08-13 ENCOUNTER — Other Ambulatory Visit: Payer: Self-pay | Admitting: Internal Medicine

## 2012-08-13 ENCOUNTER — Encounter: Payer: Self-pay | Admitting: Cardiology

## 2012-08-13 ENCOUNTER — Encounter: Payer: Self-pay | Admitting: Internal Medicine

## 2012-08-13 VITALS — BP 126/82 | HR 97 | Ht 62.0 in | Wt 245.1 lb

## 2012-08-13 DIAGNOSIS — F909 Attention-deficit hyperactivity disorder, unspecified type: Secondary | ICD-10-CM

## 2012-08-13 DIAGNOSIS — I10 Essential (primary) hypertension: Secondary | ICD-10-CM

## 2012-08-13 DIAGNOSIS — E039 Hypothyroidism, unspecified: Secondary | ICD-10-CM

## 2012-08-13 NOTE — Patient Instructions (Signed)
Continue your current therapy  You can continue Adderall.  I will see you as needed.

## 2012-08-13 NOTE — Progress Notes (Signed)
Christina Hays Date of Birth: 1946/02/16 Medical Record #[id number]  History of Present Illness: Christina Hays is seen at the request of Dr. Cleta Hays for evaluation of her suitability to take Adderall. She is a pleasant 67 year old white female with history of hypertension. She has a history of attention deficit hyperactivity disorder. She has been taking Adderall since 2002. She reports that she has difficulty driving if she doesn't take the medication. She has no prior cardiac history. She does have a history of hypertension that has been controlled with HCTZ and losartan. She has no history of arrhythmia. She denies any side effects from the medication. She specifically denies any palpitations, dizziness, chest pain, or increase in edema. She does note mild dyspnea when walking up hill but attributes this to her weight.  Current Outpatient Prescriptions on File Prior to Visit  Medication Sig Dispense Refill  . amphetamine-dextroamphetamine (ADDERALL) 20 MG tablet Take 1 tablet (20 mg total) by mouth 2 (two) times daily.  60 tablet  0  . Cholecalciferol (VITAMIN D3) 2000 UNITS capsule Take 6,000 Units by mouth daily.      . fluticasone (FLONASE) 50 MCG/ACT nasal spray Place 2 sprays into the nose as needed.      . hydrochlorothiazide (HYDRODIURIL) 25 MG tablet Take 1 tablet (25 mg total) by mouth daily.  30 tablet  11  . levothyroxine (SYNTHROID, LEVOTHROID) 88 MCG tablet Take 1 tablet (88 mcg total) by mouth daily. Needs office visit/labs  90 tablet  3  . losartan (COZAAR) 50 MG tablet Take 1 tablet (50 mg total) by mouth daily.  30 tablet  11  . mometasone (NASONEX) 50 MCG/ACT nasal spray Place 2 sprays into the nose as needed.      . Omega-3 Fatty Acids 300 MG CAPS Take 1 capsule by mouth daily.      . pantoprazole (PROTONIX) 40 MG tablet Take 40 mg by mouth daily.      . vitamin B-12 (CYANOCOBALAMIN) 1000 MCG tablet Take 1,000 mcg by mouth daily.       No current facility-administered  medications on file prior to visit.    Allergies  Allergen Reactions  . Citalopram Hydrobromide     psychotic  . Codeine     hallucinations  . Penicillins Hives    Past Medical History  Diagnosis Date  . Hypertension   . Hypothyroidism   . ADD (attention deficit disorder with hyperactivity)   . Hearing loss   . Obesity   . Depression   . Migraine   . Cholesteatoma of ear   . Allergy   . Barrett's esophagus     Past Surgical History  Procedure Laterality Date  . Tympanic membrane repair  1990    tighting  . Middle ear surgery  01/2011    replaced missing malleus    History  Smoking status  . Never Smoker   Smokeless tobacco  . Never Used    History  Alcohol Use  . Yes    Comment: one glass of wine a month.    Family History  Problem Relation Age of Onset  . Liver cancer Maternal Grandfather   . Colon cancer Maternal Grandmother   . Hypertension Maternal Grandmother   . Thyroid disease Maternal Grandmother   . Colon polyps Mother   . Emphysema Mother   . Thyroid disease Mother   . Emphysema Father   . Esophageal cancer Neg Hx   . Stomach cancer Neg Hx   . Rectal  cancer Neg Hx     Review of Systems: The review of systems is positive for mild glucose intolerance.  All other systems were reviewed and are negative.  Physical Exam: BP 126/82  Pulse 97  Ht 5\' 2"  (1.575 m)  Wt 245 lb 1.9 oz (111.186 kg)  BMI 44.82 kg/m2 She is a pleasant, obese white female in no acute distress. HEENT: Normocephalic, atraumatic. Pupils equal round and reactive. Sclera are clear. Neck is supple without JVD, adenopathy, thyromegaly, or bruits. Lungs: Clear Cardiovascular: Regular rate and rhythm, normal S1 and S2, no gallop, murmur, or click. Abdomen: Obese, soft, nontender. Bowel sounds are positive. Extremities: no cyanosis or edema. Pedal pulses are 2+. Neuro: Alert and oriented x3. Cranial nerves II through XII are intact. 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 Cannot rule out septal infarct age undetermined.  Assessment / Plan: 1. ADHD. I see no contraindication to using Adderall. His cardiac affect are primarily on increased heart rate and blood pressure and susceptibility to arrhythmias. Her blood pressure is under excellent control and her current medications. She has no history of arrhythmia. She has been on this medication for the past 10 years. I recommended that she may continue on her current therapy.  2. Abnormal ECG. I think her poor R wave progression in the precordial leads is related to her body habitus.  3. Hypertension, controlled.  4. Morbid obesity. I encouraged her efforts at weight loss and regular aerobic exercise.

## 2012-08-15 ENCOUNTER — Telehealth: Payer: Self-pay | Admitting: Family Medicine

## 2012-08-15 DIAGNOSIS — F988 Other specified behavioral and emotional disorders with onset usually occurring in childhood and adolescence: Secondary | ICD-10-CM

## 2012-08-15 MED ORDER — AMPHETAMINE-DEXTROAMPHETAMINE 20 MG PO TABS
20.0000 mg | ORAL_TABLET | Freq: Two times a day (BID) | ORAL | Status: DC
Start: 2012-08-15 — End: 2012-09-05

## 2012-08-15 NOTE — Telephone Encounter (Signed)
lmom that rx is ready for pickup.  

## 2012-08-15 NOTE — Telephone Encounter (Signed)
Meds ordered this encounter  Medications  . amphetamine-dextroamphetamine (ADDERALL) 20 MG tablet    Sig: Take 1 tablet (20 mg total) by mouth 2 (two) times daily.    Dispense:  60 tablet    Refill:  0    

## 2012-08-15 NOTE — Telephone Encounter (Signed)
    Original authorizing provider: Tonye Pearson, MD      Garry Heater Fiorenza would like a refill of the following medications:   amphetamine-dextroamphetamine (ADDERALL) 20 MG tablet [DOOLITTLE, Harrel Lemon, MD]      Preferred pharmacy: [id number] [place], [place] - [address]      Comment:   Saw Dr. Peter [place], [facility], today, 08/13/2012, as requested by Dr. Cleta Alberts. He will be sending a note to Dr. Cleta Alberts regarding Adderall. Meanwhile, I do not have enough to carry me through until I have my physical with Dr. Merla Riches on September 05, 2012. Thank you, Corrie Dandy

## 2012-08-28 ENCOUNTER — Other Ambulatory Visit: Payer: Self-pay | Admitting: Emergency Medicine

## 2012-09-05 ENCOUNTER — Ambulatory Visit (INDEPENDENT_AMBULATORY_CARE_PROVIDER_SITE_OTHER): Payer: BC Managed Care – PPO | Admitting: Internal Medicine

## 2012-09-05 ENCOUNTER — Encounter: Payer: Self-pay | Admitting: Internal Medicine

## 2012-09-05 VITALS — BP 116/49 | HR 82 | Temp 97.5°F | Resp 16 | Ht 62.0 in | Wt 245.0 lb

## 2012-09-05 DIAGNOSIS — E785 Hyperlipidemia, unspecified: Secondary | ICD-10-CM

## 2012-09-05 DIAGNOSIS — Z6841 Body Mass Index (BMI) 40.0 and over, adult: Secondary | ICD-10-CM | POA: Insufficient documentation

## 2012-09-05 DIAGNOSIS — K2270 Barrett's esophagus without dysplasia: Secondary | ICD-10-CM

## 2012-09-05 DIAGNOSIS — F988 Other specified behavioral and emotional disorders with onset usually occurring in childhood and adolescence: Secondary | ICD-10-CM

## 2012-09-05 DIAGNOSIS — M1990 Unspecified osteoarthritis, unspecified site: Secondary | ICD-10-CM

## 2012-09-05 DIAGNOSIS — Z0000 Encounter for general adult medical examination without abnormal findings: Secondary | ICD-10-CM

## 2012-09-05 DIAGNOSIS — I10 Essential (primary) hypertension: Secondary | ICD-10-CM

## 2012-09-05 DIAGNOSIS — E041 Nontoxic single thyroid nodule: Secondary | ICD-10-CM

## 2012-09-05 LAB — COMPREHENSIVE METABOLIC PANEL
ALT: 21 U/L (ref 0–35)
AST: 19 U/L (ref 0–37)
Albumin: 4.1 g/dL (ref 3.5–5.2)
BUN: 16 mg/dL (ref 6–23)
CO2: 25 mEq/L (ref 19–32)
Calcium: 9.2 mg/dL (ref 8.4–10.5)
Chloride: 101 mEq/L (ref 96–112)
Potassium: 3.5 mEq/L (ref 3.5–5.3)

## 2012-09-05 LAB — LIPID PANEL
Cholesterol: 219 mg/dL — ABNORMAL HIGH (ref 0–200)
Total CHOL/HDL Ratio: 3.2 Ratio

## 2012-09-05 LAB — POCT UA - MICROSCOPIC ONLY
Casts, Ur, LPF, POC: NEGATIVE
Crystals, Ur, HPF, POC: NEGATIVE
Mucus, UA: NEGATIVE
Yeast, UA: NEGATIVE

## 2012-09-05 LAB — CBC
HCT: 38.6 % (ref 36.0–46.0)
Hemoglobin: 13.5 g/dL (ref 12.0–15.0)
MCV: 85.4 fL (ref 78.0–100.0)
RBC: 4.52 MIL/uL (ref 3.87–5.11)
RDW: 14.2 % (ref 11.5–15.5)
WBC: 7.4 10*3/uL (ref 4.0–10.5)

## 2012-09-05 LAB — POCT URINALYSIS DIPSTICK
Ketones, UA: NEGATIVE
Protein, UA: NEGATIVE
Spec Grav, UA: 1.015
Urobilinogen, UA: 0.2
pH, UA: 5.5

## 2012-09-05 MED ORDER — HYDROCHLOROTHIAZIDE 25 MG PO TABS: ORAL_TABLET | ORAL | Status: DC

## 2012-09-05 MED ORDER — AMPHETAMINE-DEXTROAMPHETAMINE 20 MG PO TABS: 20.0000 mg | ORAL_TABLET | Freq: Two times a day (BID) | ORAL | Status: DC

## 2012-09-05 MED ORDER — LOSARTAN POTASSIUM 50 MG PO TABS: 50.0000 mg | ORAL_TABLET | Freq: Every day | ORAL | Status: DC

## 2012-09-05 MED ORDER — LEVOTHYROXINE SODIUM 100 MCG PO TABS: 100.0000 ug | ORAL_TABLET | Freq: Every day | ORAL | Status: DC

## 2012-09-05 MED ORDER — AMPHETAMINE-DEXTROAMPHETAMINE 20 MG PO TABS
20.0000 mg | ORAL_TABLET | Freq: Two times a day (BID) | ORAL | Status: DC
Start: 2012-09-05 — End: 2012-09-05

## 2012-09-05 NOTE — Progress Notes (Signed)
  Subjective:    Patient ID: Christina Hays, female    DOB: 13-May-1946, 67 y.o.   MRN: [id number]  HPI    Review of Systems  HENT: Positive for hearing loss and tinnitus.   Cardiovascular: Positive for leg swelling.  Musculoskeletal: Positive for joint swelling.       Objective:   Physical Exam        Assessment & Plan:

## 2012-09-05 NOTE — Progress Notes (Signed)
Subjective:    Patient ID: Christina Hays, female    DOB: December 25, 1945, 67 y.o.   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 Hypothyroid  . HTN (hypertension)  . ADD (attention deficit disorder with hyperactivity)  . Cyclic vomiting syndrome    -  Elevated BMI  1-2 doses of Adderall a day as in past Continues vitamin D supplements Hydro diarrheal and losartan for hypertension Synthroid 88 mcg//she feels cold, tired, with some weight gain and fluid retention, and notes hair loss generalized and suspects that she needs more Synthroid/recent T4 in the low normal range   Review of Systems  Constitutional: Negative for activity change, appetite change, fatigue and unexpected weight change.  HENT: Positive for hearing loss, rhinorrhea, dental problem and tinnitus. Negative for trouble swallowing and neck pain.   Eyes: Negative for photophobia and visual disturbance.  Respiratory: Negative for apnea, choking, shortness of breath and wheezing.   Cardiovascular: Positive for leg swelling. Negative for chest pain and palpitations.  Gastrointestinal: Negative for abdominal pain, diarrhea, constipation and blood in stool.  Endocrine: Positive for cold intolerance. Negative for heat intolerance and polyuria.  Genitourinary: Negative for frequency and pelvic pain.  Musculoskeletal: Positive for joint swelling and arthralgias.  Skin: Negative for rash.  Allergic/Immunologic: Positive for environmental allergies.  Neurological: Negative for dizziness, syncope and headaches.  Hematological: Negative for adenopathy.  Does not bruise/bleed easily.  Psychiatric/Behavioral: Negative for behavioral problems, sleep disturbance, dysphoric mood and agitation. The patient is not nervous/anxious.        Objective:   Physical Exam BP 116/49  Pulse 82  Temp(Src) 97.5 F (36.4 C)  Resp 16  Ht 5\' 2"  (1.575 m)  Wt 245 lb (111.131 kg)  BMI 44.8 kg/m2 NAD Eyes= PERRLA/EOMs intact Nose slightly boggy Oropharynx shows upper Dental plate No nodes Barely palpable nodule right lower lobe thyroid Lungs clear Heart regular without murmur/no carotid bruits Abdomen with no organomegaly or masses/nontender Extremities with 1+ pitting/intact peripheral pulses/no sensory losses Cranial nerves II through XII intact Gait normal Cerebellar intact       Assessment & Plan:  Annual exam Problem #1 hypertension Problem #2 history of cyclic vomiting now controlled Problem #3 history of thyroid nodule with normal fine needle aspiration Problem #4 hypothyroidism Problem #5 elevated BMI  Meds ordered this encounter  Medications  . levothyroxine (SYNTHROID, LEVOTHROID) 100 MCG tablet    Sig: Take 1 tablet (100 mcg total) by mouth daily.    Dispense:  90 tablet    Refill:  3                 will increase dose to see if it effects her symptoms and keeps free T4 and a higher normal range /check labs in 6 months /she will send me symptoms in my chart   . losartan (COZAAR) 50 MG tablet    Sig: Take 1 tablet (50 mg total) by mouth daily.    Dispense:  30 tablet    Refill:  11  .  amphetamine-dextroamphetamine (ADDERALL) 20 MG tablet    Sig: Take 1 tablet (20 mg total) by mouth 2 (two)  times daily.    Dispense:  60 tablet    Refill:  0  . hydrochlorothiazide (HYDRODIURIL) 25 MG tablet    Sig: TAKE 1 TABLET BY MOUTH EVERY DAY    Dispense:  90 tablet    Refill:  3  . amphetamine-dextroamphetamine (ADDERALL) 20 MG tablet    Sig: Take 1 tablet (20 mg total) by mouth 2 (two) times daily.    Dispense:  60 tablet     Refill:  0       10/06/2012   . amphetamine-dextroamphetamine (ADDERALL) 20 MG tablet    Sig: Take 1 tablet (20 mg total) by mouth 2 (two) times daily.    Dispense:  60 tablet    Refill:  0        11/05/2012

## 2012-09-06 LAB — T4, FREE: Free T4: 1.61 ng/dL (ref 0.80–1.80)

## 2012-09-10 ENCOUNTER — Encounter: Payer: Self-pay | Admitting: Internal Medicine

## 2012-09-10 DIAGNOSIS — E785 Hyperlipidemia, unspecified: Secondary | ICD-10-CM | POA: Insufficient documentation

## 2012-09-10 DIAGNOSIS — E041 Nontoxic single thyroid nodule: Secondary | ICD-10-CM | POA: Insufficient documentation

## 2012-09-10 DIAGNOSIS — K2270 Barrett's esophagus without dysplasia: Secondary | ICD-10-CM | POA: Insufficient documentation

## 2012-09-10 DIAGNOSIS — M1990 Unspecified osteoarthritis, unspecified site: Secondary | ICD-10-CM | POA: Insufficient documentation

## 2012-09-10 NOTE — Addendum Note (Signed)
Addended by: Tonye Pearson on: 09/10/2012 12:35 PM   Modules accepted: Orders

## 2012-09-28 ENCOUNTER — Ambulatory Visit
Admission: RE | Admit: 2012-09-28 | Discharge: 2012-09-28 | Disposition: A | Discharge status: Home / Self Care | Payer: BC Managed Care – PPO | Source: Ambulatory Visit | Attending: Internal Medicine | Admitting: Internal Medicine

## 2012-09-28 DIAGNOSIS — E041 Nontoxic single thyroid nodule: Secondary | ICD-10-CM

## 2012-10-01 ENCOUNTER — Encounter: Payer: Self-pay | Admitting: Internal Medicine

## 2012-10-10 ENCOUNTER — Encounter: Payer: Self-pay | Admitting: Internal Medicine

## 2012-10-19 ENCOUNTER — Encounter: Payer: Self-pay | Admitting: Gastroenterology

## 2012-10-31 ENCOUNTER — Other Ambulatory Visit: Payer: Self-pay

## 2012-10-31 DIAGNOSIS — Z1231 Encounter for screening mammogram for malignant neoplasm of breast: Secondary | ICD-10-CM

## 2012-12-04 ENCOUNTER — Ambulatory Visit
Admission: RE | Admit: 2012-12-04 | Discharge: 2012-12-04 | Disposition: A | Discharge status: Home / Self Care | Payer: BC Managed Care – PPO | Source: Ambulatory Visit

## 2012-12-04 DIAGNOSIS — Z1231 Encounter for screening mammogram for malignant neoplasm of breast: Secondary | ICD-10-CM

## 2013-01-30 ENCOUNTER — Other Ambulatory Visit: Payer: Self-pay

## 2013-01-31 ENCOUNTER — Encounter: Payer: Self-pay | Admitting: Gastroenterology

## 2013-01-31 ENCOUNTER — Encounter: Payer: Self-pay | Admitting: Internal Medicine

## 2013-01-31 DIAGNOSIS — E041 Nontoxic single thyroid nodule: Secondary | ICD-10-CM

## 2013-01-31 NOTE — Telephone Encounter (Signed)
done

## 2013-02-02 ENCOUNTER — Other Ambulatory Visit: Payer: Self-pay | Admitting: Gastroenterology

## 2013-02-02 ENCOUNTER — Other Ambulatory Visit: Payer: Self-pay | Admitting: Emergency Medicine

## 2013-03-29 ENCOUNTER — Ambulatory Visit (INDEPENDENT_AMBULATORY_CARE_PROVIDER_SITE_OTHER): Payer: BC Managed Care – PPO | Admitting: Gastroenterology

## 2013-03-29 ENCOUNTER — Encounter: Payer: Self-pay | Admitting: Gastroenterology

## 2013-03-29 VITALS — BP 122/84 | HR 68 | Ht 62.0 in | Wt 241.6 lb

## 2013-03-29 DIAGNOSIS — R1115 Cyclical vomiting syndrome unrelated to migraine: Secondary | ICD-10-CM

## 2013-03-29 DIAGNOSIS — Z1211 Encounter for screening for malignant neoplasm of colon: Secondary | ICD-10-CM

## 2013-03-29 DIAGNOSIS — K2270 Barrett's esophagus without dysplasia: Secondary | ICD-10-CM

## 2013-03-29 NOTE — Assessment & Plan Note (Signed)
Plan followup endoscopy 

## 2013-03-29 NOTE — Assessment & Plan Note (Signed)
Plan screening colonoscopy 

## 2013-03-29 NOTE — Assessment & Plan Note (Signed)
Etiology has not been determined.  Symptoms are fairly mild at this time.  There is no obvious instigating medicine or food.

## 2013-03-29 NOTE — Progress Notes (Signed)
History of Present Illness:  Mrs. Christina Hays has returned for followup of Barrett's esophagus.  Barrett's was noted at endoscopy when she underwent workup for cyclic vomiting.  Colonoscopy was recommended but this was not done.  In the past 15 months she's had only a handful of limited episodes of vomiting.  She said she had 2 episodes in the last 2 weeks.  She is without pain.  Pyrosis and belching has significantly improved with Protonix.  Last colonoscopy in 2003 demonstrated hemorrhoids and diverticulosis.    Review of Systems: Pertinent positive and negative review of systems were noted in the above HPI section. All other review of systems were otherwise negative.    Current Medications, Allergies, Past Medical History, Past Surgical History, Family History and Social History were reviewed in [employer] electronic medical record  Vital signs were reviewed in today's medical record. Physical Exam: General: Well developed , well nourished, no acute distress Skin: anicteric Head: Normocephalic and atraumatic Eyes:  sclerae anicteric, EOMI Ears: Normal auditory acuity Mouth: No deformity or lesions Lungs: Clear throughout to auscultation Heart: Regular rate and rhythm; no murmurs, rubs or bruits Abdomen: Soft, non tender and non distended. No masses, hepatosplenomegaly or hernias noted. Normal Bowel sounds.  There is no succussion splash Rectal:deferred Musculoskeletal: Symmetrical with no gross deformities  Pulses:  Normal pulses noted Extremities: No clubbing, cyanosis, edema or deformities noted Neurological: Alert oriented x 4, grossly nonfocal Psychological:  Alert and cooperative. Normal mood and affect

## 2013-03-29 NOTE — Addendum Note (Signed)
Addended by: Marlowe Kays on: 03/29/2013 03:34 PM   Modules accepted: Orders

## 2013-03-29 NOTE — Patient Instructions (Addendum)

## 2013-04-01 ENCOUNTER — Encounter: Payer: Self-pay | Admitting: Gastroenterology

## 2013-04-09 ENCOUNTER — Ambulatory Visit
Admission: RE | Admit: 2013-04-09 | Discharge: 2013-04-09 | Disposition: A | Discharge status: Home / Self Care | Payer: BC Managed Care – PPO | Source: Ambulatory Visit | Attending: Internal Medicine | Admitting: Internal Medicine

## 2013-04-09 ENCOUNTER — Other Ambulatory Visit (HOSPITAL_COMMUNITY)
Admission: RE | Admit: 2013-04-09 | Discharge: 2013-04-09 | Disposition: A | Discharge status: Home / Self Care | Payer: BC Managed Care – PPO | Source: Ambulatory Visit | Attending: Interventional Radiology | Admitting: Interventional Radiology

## 2013-04-09 DIAGNOSIS — E041 Nontoxic single thyroid nodule: Secondary | ICD-10-CM | POA: Insufficient documentation

## 2013-04-15 ENCOUNTER — Encounter: Payer: Self-pay | Admitting: *Deleted

## 2013-05-13 ENCOUNTER — Telehealth: Payer: Self-pay | Admitting: Gastroenterology

## 2013-05-13 NOTE — Telephone Encounter (Signed)
no

## 2013-05-14 ENCOUNTER — Encounter: Payer: BC Managed Care – PPO | Admitting: Gastroenterology

## 2013-06-06 ENCOUNTER — Ambulatory Visit: Payer: BC Managed Care – PPO | Admitting: Physician Assistant

## 2013-06-06 VITALS — BP 138/82 | HR 91 | Temp 98.8°F | Resp 16 | Ht 62.0 in | Wt 245.0 lb

## 2013-06-06 DIAGNOSIS — F988 Other specified behavioral and emotional disorders with onset usually occurring in childhood and adolescence: Secondary | ICD-10-CM

## 2013-06-06 MED ORDER — AMPHETAMINE-DEXTROAMPHETAMINE 20 MG PO TABS: 20.0000 mg | ORAL_TABLET | Freq: Two times a day (BID) | ORAL | Status: DC

## 2013-06-06 NOTE — Progress Notes (Signed)
   Subjective:    Patient ID: Christina Hays, female    DOB: 1945/12/15, 67 y.o.   MRN: [id number]  HPI Pt presents to clinic for a medication refill.  She takes Adderall every morning and she has found that if she takes it early and gets going she only needs one a day.  She always takes a 2nd dose if she is driving.  She has never gotten the results of her thyroid biopsy and would like to know those results.  She currently feels good and plans on seeing Dr Merla Riches in March.  Review of Systems     Objective:   Physical Exam  Vitals reviewed. Constitutional: She is oriented to person, place, and time. She appears well-developed and well-nourished.  HENT:  Head: Normocephalic and atraumatic.  Right Ear: External ear normal.  Left Ear: External ear normal.  Pulmonary/Chest: Effort normal.  Neurological: She is alert and oriented to person, place, and time.  Skin: Skin is warm and dry.  Psychiatric: She has a normal mood and affect. Her behavior is normal. Judgment and thought content normal.       Assessment & Plan:  ADD (attention deficit disorder) - Plan: amphetamine-dextroamphetamine (ADDERALL) 20 MG tablet  Gave patient a copy of her thyroid biopsy - spoke with her about possible excision of that nodule and she is going to do some research and talk with Dr Merla Riches about the next step.   We discussed our controlled substance policy and the fact that she will need to see One provider for her Rx of controlled substances.  Benny Lennert PA-C 06/06/2013 7:32 PM

## 2013-06-06 NOTE — Patient Instructions (Signed)
[facility] Policy for Prescribing Controlled Substances (Revised 04/2012) 1. Prescriptions for controlled substances will be filled by ONE provider at [facility] with whom you have established and developed a plan for your care, including follow-up. 2. You are encouraged to schedule an appointment with your prescriber at our appointment center for follow-up visits whenever possible. 3. If you request a prescription for the controlled substance while at [facility] for an acute problem (with someone other than your regular prescriber), you MAY be given a ONE-TIME prescription for a 30-day supply of the controlled substance, to allow time for you to return to see your regular prescriber for additional prescriptions. 

## 2013-06-07 ENCOUNTER — Telehealth: Payer: Self-pay

## 2013-06-07 NOTE — Telephone Encounter (Signed)
Diagnosis THYROID, FINE NEEDLE ASPIRATION, RIGHT INFERIOR LOBE (SPECIMEN 1 OF 1, COLLECTED ON 04/09/13): FOLLICULAR LESION OF UNDETERMINED SIGNIFICANCE. SEE COMMENT. COMMENT: THE SPECIMEN CONTAINS SMALL GROUPS OF FOLLICULAR EPITHELIAL CELLS. SOME GROUPS HAVE A MICROFOLLICULAR ARCHITECTURE. THERE ARE SCATTERED INTRANUCLEAR GROOVES. BASED ON THESE CYTOLOGIC FEATURES, A FOLLICULAR NEOPLASM CAN NOT BE ENTIRELY RULED OUT.  Referral pended, please advise.

## 2013-06-07 NOTE — Telephone Encounter (Signed)
Dr.Doolittle, Pt would like a referral to endocrinology at [facility], and would like to speak with you about the patheology report on her thyroid. Best#[phone]

## 2013-06-10 ENCOUNTER — Encounter: Payer: Self-pay | Admitting: Internal Medicine

## 2013-06-10 ENCOUNTER — Telehealth: Payer: Self-pay | Admitting: Radiology

## 2013-06-10 DIAGNOSIS — E041 Nontoxic single thyroid nodule: Secondary | ICD-10-CM

## 2013-06-10 NOTE — Telephone Encounter (Signed)
Patient is asking in her visit survey why she did not get a call concerning her pathology from 04/09/13. I am not sure, did you get the pathology?

## 2013-06-11 ENCOUNTER — Encounter: Payer: Self-pay | Admitting: Internal Medicine

## 2013-06-12 ENCOUNTER — Encounter: Payer: Self-pay | Admitting: Internal Medicine

## 2013-06-12 NOTE — Telephone Encounter (Signed)
Discussed Will try to speed up referral

## 2013-06-13 ENCOUNTER — Telehealth: Payer: Self-pay

## 2013-06-13 NOTE — Telephone Encounter (Signed)
Thanks. Called again, left message.

## 2013-06-13 NOTE — Telephone Encounter (Signed)
Pt LM that pharm had sent req for PA on med on Tues but we have not received anything. Called pharm got info Adderall PA needed. Completed PA on covermymeds and notified pt of status.

## 2013-06-14 NOTE — Telephone Encounter (Signed)
PA approved through 06/14/16. Notified pt who will notify pharm

## 2013-07-03 ENCOUNTER — Encounter: Payer: Self-pay | Admitting: Gastroenterology

## 2013-07-03 ENCOUNTER — Emergency Department (HOSPITAL_COMMUNITY)
Admission: EM | Admit: 2013-07-03 | Discharge: 2013-07-03 | Disposition: A | Discharge status: Home / Self Care | Payer: BC Managed Care – PPO | Attending: Emergency Medicine | Admitting: Emergency Medicine

## 2013-07-03 ENCOUNTER — Encounter: Payer: Self-pay | Admitting: Internal Medicine

## 2013-07-03 DIAGNOSIS — E669 Obesity, unspecified: Secondary | ICD-10-CM | POA: Insufficient documentation

## 2013-07-03 DIAGNOSIS — Z79899 Other long term (current) drug therapy: Secondary | ICD-10-CM | POA: Insufficient documentation

## 2013-07-03 DIAGNOSIS — E039 Hypothyroidism, unspecified: Secondary | ICD-10-CM | POA: Insufficient documentation

## 2013-07-03 DIAGNOSIS — H9190 Unspecified hearing loss, unspecified ear: Secondary | ICD-10-CM | POA: Insufficient documentation

## 2013-07-03 DIAGNOSIS — F909 Attention-deficit hyperactivity disorder, unspecified type: Secondary | ICD-10-CM | POA: Insufficient documentation

## 2013-07-03 DIAGNOSIS — R112 Nausea with vomiting, unspecified: Secondary | ICD-10-CM

## 2013-07-03 DIAGNOSIS — Z8719 Personal history of other diseases of the digestive system: Secondary | ICD-10-CM | POA: Insufficient documentation

## 2013-07-03 DIAGNOSIS — I10 Essential (primary) hypertension: Secondary | ICD-10-CM | POA: Insufficient documentation

## 2013-07-03 DIAGNOSIS — Z880 Allergy status to penicillin: Secondary | ICD-10-CM | POA: Insufficient documentation

## 2013-07-03 LAB — URINE MICROSCOPIC-ADD ON

## 2013-07-03 LAB — CBC WITH DIFFERENTIAL/PLATELET
Basophils Absolute: 0 10*3/uL (ref 0.0–0.1)
Basophils Relative: 0 % (ref 0–1)
EOS PCT: 1 % (ref 0–5)
Eosinophils Absolute: 0.1 10*3/uL (ref 0.0–0.7)
HCT: 38.8 % (ref 36.0–46.0)
Hemoglobin: 13.4 g/dL (ref 12.0–15.0)
LYMPHS ABS: 1.5 10*3/uL (ref 0.7–4.0)
LYMPHS PCT: 12 % (ref 12–46)
MCH: 30.6 pg (ref 26.0–34.0)
MCHC: 34.5 g/dL (ref 30.0–36.0)
MCV: 88.6 fL (ref 78.0–100.0)
Monocytes Absolute: 0.8 10*3/uL (ref 0.1–1.0)
Monocytes Relative: 6 % (ref 3–12)
NEUTROS PCT: 81 % — AB (ref 43–77)
Neutro Abs: 10 10*3/uL — ABNORMAL HIGH (ref 1.7–7.7)
Platelets: 279 10*3/uL (ref 150–400)
RBC: 4.38 MIL/uL (ref 3.87–5.11)
RDW: 13.2 % (ref 11.5–15.5)
WBC: 12.4 10*3/uL — ABNORMAL HIGH (ref 4.0–10.5)

## 2013-07-03 LAB — URINALYSIS, ROUTINE W REFLEX MICROSCOPIC
BILIRUBIN URINE: NEGATIVE
Glucose, UA: NEGATIVE mg/dL
HGB URINE DIPSTICK: NEGATIVE
KETONES UR: NEGATIVE mg/dL
NITRITE: NEGATIVE
PROTEIN: NEGATIVE mg/dL
SPECIFIC GRAVITY, URINE: 1.009 (ref 1.005–1.030)
Urobilinogen, UA: 0.2 mg/dL (ref 0.0–1.0)
pH: 6.5 (ref 5.0–8.0)

## 2013-07-03 LAB — COMPREHENSIVE METABOLIC PANEL
ALK PHOS: 106 U/L (ref 39–117)
ALT: 21 U/L (ref 0–35)
AST: 20 U/L (ref 0–37)
Albumin: 3.8 g/dL (ref 3.5–5.2)
BUN: 18 mg/dL (ref 6–23)
CO2: 26 meq/L (ref 19–32)
Calcium: 9.5 mg/dL (ref 8.4–10.5)
Chloride: 98 mEq/L (ref 96–112)
Creatinine, Ser: 0.97 mg/dL (ref 0.50–1.10)
GFR, EST AFRICAN AMERICAN: 69 mL/min — AB (ref >90)
GFR, EST NON AFRICAN AMERICAN: 60 mL/min — AB (ref >90)
GLUCOSE: 118 mg/dL — AB (ref 70–99)
POTASSIUM: 3.4 meq/L — AB (ref 3.7–5.3)
Sodium: 139 mEq/L (ref 137–147)
TOTAL PROTEIN: 7.3 g/dL (ref 6.0–8.3)
Total Bilirubin: 0.6 mg/dL (ref 0.3–1.2)

## 2013-07-03 LAB — LIPASE, BLOOD: LIPASE: 19 U/L (ref 11–59)

## 2013-07-03 MED ORDER — ONDANSETRON HCL 4 MG/2ML IJ SOLN
4.0000 mg | Freq: Once | INTRAMUSCULAR | Status: AC
  Administered 2013-07-03: 4 mg via INTRAVENOUS
  Filled 2013-07-03: qty 2

## 2013-07-03 MED ORDER — SODIUM CHLORIDE 0.9 % IV BOLUS (SEPSIS)
1000.0000 mL | Freq: Once | INTRAVENOUS | Status: AC
  Administered 2013-07-03: 1000 mL via INTRAVENOUS

## 2013-07-03 MED ORDER — ONDANSETRON HCL 4 MG PO TABS: 4.0000 mg | ORAL_TABLET | Freq: Four times a day (QID) | ORAL | Status: DC

## 2013-07-03 NOTE — ED Provider Notes (Signed)
CSN: [id number]     Arrival date & time 07/03/13  8295 History   First MD Initiated Contact with Patient 07/03/13 [phone]     Chief Complaint  Patient presents with  . Nausea  . Emesis   (Consider location/radiation/quality/duration/timing/severity/associated sxs/prior Treatment) HPI  68 year old female with nausea and vomiting. Onset earlier today while at work. She describes 4 episodes. Small streaks of blood in the vomitus. Denies any abdominal pain chest or neck pain. No shortness of breath. Nausea is almost completely resolved now. She reports a past history of intermittent nausea and vomiting which has been evaluated by gastroenterology. She reports a history of Barrett's esophagus but no specific explanation for her repeated nausea/vomiting. No fevers or chills. No diarrhea. No other complaints.  Past Medical History  Diagnosis Date  . Hypertension   . Hypothyroidism   . ADD (attention deficit disorder with hyperactivity)   . Hearing loss   . Obesity   . Depression   . Migraine   . Cholesteatoma of ear   . Allergy   . Barrett's esophagus    Past Surgical History  Procedure Laterality Date  . Tympanic membrane repair  1990    tighting  . Middle ear surgery  01/2011    replaced missing malleus   Family History  Problem Relation Age of Onset  . Liver cancer Maternal Grandfather   . Colon cancer Maternal Grandmother   . Hypertension Maternal Grandmother   . Thyroid disease Maternal Grandmother   . Colon polyps Mother   . Emphysema Mother   . Thyroid disease Mother   . Emphysema Father   . Esophageal cancer Neg Hx   . Stomach cancer Neg Hx   . Rectal cancer Neg Hx    History  Substance Use Topics  . Smoking status: Never Smoker   . Smokeless tobacco: Never Used  . Alcohol Use: Yes     Comment: one glass of wine a month.   OB History   Grav Para Term Preterm Abortions TAB SAB Ect Mult Living                 Review of Systems  All systems reviewed and negative,  other than as noted in HPI.   Allergies  Citalopram hydrobromide; Codeine; and Penicillins  Home Medications   Current Outpatient Rx  Name  Route  Sig  Dispense  Refill  . amphetamine-dextroamphetamine (ADDERALL) 20 MG tablet   Oral   Take 1 tablet (20 mg total) by mouth 2 (two) times daily.   60 tablet   0   . Cholecalciferol (VITAMIN D3) 2000 UNITS capsule   Oral   Take 6,000 Units by mouth daily.         . hydrochlorothiazide (HYDRODIURIL) 25 MG tablet      TAKE 1 TABLET BY MOUTH EVERY DAY   90 tablet   3   . levothyroxine (SYNTHROID, LEVOTHROID) 100 MCG tablet   Oral   Take 1 tablet (100 mcg total) by mouth daily.   90 tablet   3   . losartan (COZAAR) 50 MG tablet      TAKE 1 TABLET BY MOUTH EVERY DAY   90 tablet   2     PT NEEDS A 90 DAY SUPPLY.   . multivitamin-lutein (OCUVITE-LUTEIN) CAPS capsule   Oral   Take 1 capsule by mouth daily.         . Omega-3 Fatty Acids 300 MG CAPS   Oral   Take 1  capsule by mouth daily.         . pantoprazole (PROTONIX) 40 MG tablet      TAKE 1 TABLET EVERY DAY   90 tablet   3    BP 109/82  Pulse 102  Temp(Src) 98 F (36.7 C) (Oral)  Resp 16  SpO2 98% Physical Exam  Nursing note and vitals reviewed. Constitutional: She appears well-developed and well-nourished. No distress.  HENT:  Head: Normocephalic and atraumatic.  Eyes: Conjunctivae are normal. Right eye exhibits no discharge. Left eye exhibits no discharge.  Neck: Neck supple.  Cardiovascular: Normal rate, regular rhythm and normal heart sounds.  Exam reveals no gallop and no friction rub.   No murmur heard. Pulmonary/Chest: Effort normal and breath sounds normal. No respiratory distress.  Abdominal: Soft. She exhibits no distension. There is no tenderness.  Musculoskeletal: She exhibits no edema and no tenderness.  Neurological: She is alert.  Skin: Skin is warm and dry.  Psychiatric: She has a normal mood and affect. Her behavior is normal.  Thought content normal.    ED Course  Procedures (including critical care time) Labs Review Labs Reviewed  CBC WITH DIFFERENTIAL - Abnormal; Notable for the following:    WBC 12.4 (*)    Neutrophils Relative % 81 (*)    Neutro Abs 10.0 (*)    All other components within normal limits  COMPREHENSIVE METABOLIC PANEL - Abnormal; Notable for the following:    Potassium 3.4 (*)    Glucose, Bld 118 (*)    GFR calc non Af Amer 60 (*)    GFR calc Af Amer 69 (*)    All other components within normal limits  URINALYSIS, ROUTINE W REFLEX MICROSCOPIC - Abnormal; Notable for the following:    Leukocytes, UA TRACE (*)    All other components within normal limits  LIPASE, BLOOD  URINE MICROSCOPIC-ADD ON   Imaging Review No results found.  EKG Interpretation   None       MDM   1. Nausea and vomiting in adult    68 year old female with nausea and vomiting. Resolved. No abdominal pain she has benign abdominal exam. She describes a small amount of blood. Suspect Mallory-Weiss tear. None in the emergency room. No blood thinners. H&H is normal. Hemodynamically stable. Has established GI care. Return precautions discussed. Outpatient followup.    Raeford RazorStephen Aaryan Essman, MD 07/05/13 [phone]

## 2013-07-03 NOTE — Discharge Instructions (Signed)

## 2013-07-03 NOTE — ED Notes (Signed)
Pt states that she has been vomiting since 0900 this morning.  States that she saw streaks of blood.  Has thrown up 4 times since she started vomiting this morning.  Denies pain.  Has been seeing dr. Arlyce DiceKaplan "trying to figure out why she throws up for no reason" for the past 18 months.  Had an endoscopy last year and was told she has Barrett's esophagus.  Denies diarrhea.

## 2013-07-03 NOTE — ED Notes (Signed)
MD at bedside. 

## 2013-07-03 NOTE — ED Notes (Signed)
Bed: WA20 Expected date:  Expected time:  Means of arrival:  Comments: EMS-[facility] 

## 2013-07-05 ENCOUNTER — Telehealth: Payer: Self-pay | Admitting: Radiology

## 2013-07-05 NOTE — Telephone Encounter (Signed)
Dr Merla Richesoolittle wants us to call and see if they can move up appt at [facility], it is in April. I will call on Monday.

## 2013-07-07 NOTE — Telephone Encounter (Signed)
Lupita LeashDonna can you see if you could do this, Amy is starting her new position at [employer] and may not get to do this. Thanks.

## 2013-07-09 ENCOUNTER — Encounter: Payer: Self-pay | Admitting: Internal Medicine

## 2013-07-10 NOTE — Telephone Encounter (Signed)
Pt states that her appt was cancelled.  Can you please reschedule?  Please reply thru mychart.  Thanks

## 2013-07-11 ENCOUNTER — Other Ambulatory Visit: Payer: Self-pay

## 2013-07-11 ENCOUNTER — Encounter: Payer: Self-pay | Admitting: Gastroenterology

## 2013-07-11 MED ORDER — CLARITHROMYCIN 500 MG PO TABS: 500.0000 mg | ORAL_TABLET | Freq: Two times a day (BID) | ORAL | Status: DC

## 2013-07-16 ENCOUNTER — Encounter: Payer: BC Managed Care – PPO | Admitting: Gastroenterology

## 2013-07-16 ENCOUNTER — Telehealth: Payer: Self-pay | Admitting: Gastroenterology

## 2013-07-16 NOTE — Telephone Encounter (Signed)
No message was left. 

## 2013-08-20 ENCOUNTER — Ambulatory Visit (INDEPENDENT_AMBULATORY_CARE_PROVIDER_SITE_OTHER): Payer: BC Managed Care – PPO | Admitting: Internal Medicine

## 2013-08-20 ENCOUNTER — Encounter: Payer: Self-pay | Admitting: Internal Medicine

## 2013-08-20 VITALS — BP 118/68 | HR 107 | Temp 98.3°F | Resp 16 | Ht 64.0 in | Wt 239.2 lb

## 2013-08-20 DIAGNOSIS — Z6841 Body Mass Index (BMI) 40.0 and over, adult: Secondary | ICD-10-CM

## 2013-08-20 DIAGNOSIS — F909 Attention-deficit hyperactivity disorder, unspecified type: Secondary | ICD-10-CM

## 2013-08-20 DIAGNOSIS — I10 Essential (primary) hypertension: Secondary | ICD-10-CM

## 2013-08-20 DIAGNOSIS — R1115 Cyclical vomiting syndrome unrelated to migraine: Secondary | ICD-10-CM

## 2013-08-20 DIAGNOSIS — E041 Nontoxic single thyroid nodule: Secondary | ICD-10-CM

## 2013-08-20 DIAGNOSIS — E039 Hypothyroidism, unspecified: Secondary | ICD-10-CM

## 2013-08-20 DIAGNOSIS — F988 Other specified behavioral and emotional disorders with onset usually occurring in childhood and adolescence: Secondary | ICD-10-CM

## 2013-08-20 MED ORDER — AMPHETAMINE-DEXTROAMPHETAMINE 20 MG PO TABS
20.0000 mg | ORAL_TABLET | Freq: Two times a day (BID) | ORAL | Status: DC
Start: 2013-08-20 — End: 2013-09-18

## 2013-08-20 MED ORDER — AMPHETAMINE-DEXTROAMPHETAMINE 20 MG PO TABS: 20.0000 mg | ORAL_TABLET | Freq: Two times a day (BID) | ORAL | Status: DC

## 2013-08-20 MED ORDER — ZOFRAN ODT 8 MG PO TBDP: 8.0000 mg | ORAL_TABLET | Freq: Three times a day (TID) | ORAL | Status: DC | PRN

## 2013-08-20 NOTE — Progress Notes (Signed)
Subjective:  This chart was scribed for Ellamae Sia, MD by Quintella Reichert, ED scribe.  This patient was seen in [facility] Room 10 and the patient's care was started at 8:11 AM.  Chief Complaint  Patient presents with  . Medication Refill    adderal     Patient ID: Christina Hays, female    DOB: [birth year], 68 y.o.   MRN: [id number]  HPI  HPI Comments: Christina Hays is a 68 y.o. female who presents to [facility] for a refill on her Adderall.    Pt states she has been having some depression recently.  She has struggled with this throughout her life.  She will be retiring soon and is feeling some "panic" about not working for the first time in her life. This is not yet interfering with activities or sleep.  She has had issues with vomiting recently.  She was at [facility] in January for hematemesis and diagnosed with an esophageal tear which she was told would heal on its own. She has no known problems with cyclic vomiting syndrome and has been evaluated.   She was seen for her GI issues by Dr. Arlyce Dice in 2013 and received multiple tests.  She has been diagnosed with Barrett's esophagus and takes Protonix. These episodes of vomiting are of sudden onset and made worse if she tries to eat. They resolve in a few hours without treatment.  Pt is asking whether she needs a thyroid test prior to her appointment with Dr. Lawerance Bach in April.  She has been diagnosed with a thyroid module and this has been followed by ultrasound over the years. 06/30/11=IMPRESSION:  Solid nodule in the lower pole of the right lobe of 3.2 x 2.5 x 2.6  cm. Recommend biopsy(not done) 09/28/12 impression :  Mild thyromegaly with solitary 3.4 cm right nodule. Findings  meet consensus criteria for biopsy. Ultrasound-guided fine needle  aspiration should be considered,   THYROID, FINE NEEDLE ASPIRATION, RIGHT INFERIOR LOBE (SPECIMEN 1 OF 1, COLLECTED ON 04/09/13): FOLLICULAR LESION OF UNDETERMINED SIGNIFICANCE. SEE  COMMENT. COMMENT: THE SPECIMEN CONTAINS SMALL GROUPS OF FOLLICULAR EPITHELIAL CELLS. SOME GROUPS HAVE A MICROFOLLICULAR ARCHITECTURE. THERE ARE SCATTERED INTRANUCLEAR GROOVES. BASED ON THESE CYTOLOGIC FEATURES, A FOLLICULAR NEOPLASM CAN NOT BE ENTIRELY RULED OUT.  We have set up evaluation by endocrine at [facility] to decide if aggressive or conservative approach is best.  Patient Active Problem List   Diagnosis Date Noted  . Special screening for malignant neoplasms, colon 03/29/2013  . Barrett's esophagus 09/10/2012  . Thyroid nodule 09/10/2012  . Other and unspecified hyperlipidemia 09/10/2012  . Osteoarthritis 09/10/2012  . BMI 40.0-44.9, adult 09/05/2012  . Hypothyroid 07/27/2011  . HTN (hypertension) 07/27/2011  . ADD (attention deficit disorder with hyperactivity) 07/27/2011  . Cyclic vomiting syndrome 07/27/2011    Past Medical History  Diagnosis Date  . Hypertension   . Hypothyroidism   . ADD (attention deficit disorder with hyperactivity)   . Hearing loss   . Obesity   . Depression   . Migraine   . Cholesteatoma of ear   . Allergy   . Barrett's esophagus     Past Surgical History  Procedure Laterality Date  . Tympanic membrane repair  1990    tighting  . Middle ear surgery  01/2011    replaced missing malleus    Prior to Admission medications   Medication Sig Start Date End Date Taking? Authorizing Provider  amphetamine-dextroamphetamine (ADDERALL) 20 MG tablet Take 1 tablet (20  mg total) by mouth 2 (two) times daily. 06/06/13  Yes Morrell RiddleSarah L Weber, PA-C  hydrochlorothiazide (HYDRODIURIL) 25 MG tablet Take 25 mg by mouth daily.   Yes Historical Provider, MD  levothyroxine (SYNTHROID, LEVOTHROID) 100 MCG tablet Take 1 tablet (100 mcg total) by mouth daily. 09/05/12  Yes Tonye Pearsonobert P Jolinda Pinkstaff, MD  losartan (COZAAR) 50 MG tablet Take 50 mg by mouth daily.   Yes Historical Provider, MD  multivitamin-lutein (OCUVITE-LUTEIN) CAPS capsule Take 1 capsule by mouth  daily.   Yes Historical Provider, MD  ondansetron (ZOFRAN) 4 MG tablet Take 1 tablet (4 mg total) by mouth every 6 (six) hours. 07/03/13  Yes Raeford RazorStephen Kohut, MD  pantoprazole (PROTONIX) 20 MG tablet Take 40 mg by mouth daily.    Yes Historical Provider, MD  Cholecalciferol (VITAMIN D3) 2000 UNITS capsule Take 6,000 Units by mouth daily.    Historical Provider, MD     Review of Systems  Constitutional: Negative for fever.  Gastrointestinal: Positive for nausea and vomiting.  Psychiatric/Behavioral: Positive for dysphoric mood. The patient is nervous/anxious.         Objective:   Physical Exam  Nursing note and vitals reviewed. Constitutional: She is oriented to person, place, and time. She appears well-developed and well-nourished. No distress.  HENT:  Head: Normocephalic and atraumatic.  Eyes: EOM are normal.  Neck: Neck supple.  Thyroid nodule is not palpable  Cardiovascular: Normal rate.   Pulmonary/Chest: Effort normal. No respiratory distress.  Musculoskeletal: Normal range of motion.  Lymphadenopathy:    She has no cervical adenopathy.  Neurological: She is alert and oriented to person, place, and time.  Skin: Skin is warm and dry.  Psychiatric: She has a normal mood and affect. Her behavior is normal.    BP 118/68  Pulse 107  Temp(Src) 98.3 F (36.8 C) (Oral)  Resp 16  Ht 5\' 4"  (1.626 m)  Wt 239 lb 3.2 oz (108.5 kg)  BMI 41.04 kg/m2  SpO2 95%       Assessment & Plan:  ADD (attention deficit disorder) - Plan: amphetamine-dextroamphetamine (ADDERALL) 20 MG tablet  Cyclic vomiting syndrome--- trial of Zofran ODT  Thyroid nodule--- will repeat ultrasound in preparation for endocrine evaluation to see if this was decompressed by  aspiration October 2000 410  BMI 40.0-44.9, adult--- discussed weight loss again  HTN (hypertension)--due for followup in March  Hypothyroid--- due for blood work in March  Reactive depression-follow for now Meds ordered this  encounter  Medications  . amphetamine-dextroamphetamine (ADDERALL) 20 MG tablet    Sig: Take 1 tablet (20 mg total) by mouth 2 (two) times daily.    Dispense:  60 tablet    Refill:  0    Order Specific Question:  Supervising Provider    Answer:  Javon Hupfer P [[id number]]  . ZOFRAN ODT 8 MG disintegrating tablet    Sig: Take 1 tablet (8 mg total) by mouth every 8 (eight) hours as needed for nausea or vomiting.    Dispense:  8 tablet    Refill:  0  . amphetamine-dextroamphetamine (ADDERALL) 20 MG tablet    Sig: Take 1 tablet (20 mg total) by mouth 2 (two) times daily. For 30 days after signed    Dispense:  60 tablet    Refill:  0  . amphetamine-dextroamphetamine (ADDERALL) 20 MG tablet    Sig: Take 1 tablet (20 mg total) by mouth 2 (two) times daily. Fill 60 days after signing    Dispense:  60 tablet  Refill:  0      I have completed the patient encounter in its entirety as documented by the scribe, with editing by me where necessary. Yifan Auker P. Merla Riches, M.D.

## 2013-09-11 ENCOUNTER — Encounter: Payer: BC Managed Care – PPO | Admitting: Internal Medicine

## 2013-09-11 ENCOUNTER — Ambulatory Visit
Admission: RE | Admit: 2013-09-11 | Discharge: 2013-09-11 | Disposition: A | Discharge status: Home / Self Care | Payer: BC Managed Care – PPO | Source: Ambulatory Visit | Attending: Internal Medicine | Admitting: Internal Medicine

## 2013-09-11 DIAGNOSIS — E041 Nontoxic single thyroid nodule: Secondary | ICD-10-CM | POA: Diagnosis not present

## 2013-09-13 ENCOUNTER — Encounter: Payer: Self-pay | Admitting: Internal Medicine

## 2013-09-18 ENCOUNTER — Ambulatory Visit (INDEPENDENT_AMBULATORY_CARE_PROVIDER_SITE_OTHER): Payer: BC Managed Care – PPO | Admitting: Internal Medicine

## 2013-09-18 ENCOUNTER — Encounter: Payer: Self-pay | Admitting: Internal Medicine

## 2013-09-18 VITALS — BP 125/63 | HR 93 | Temp 97.9°F | Resp 16 | Ht 62.5 in | Wt 235.0 lb

## 2013-09-18 DIAGNOSIS — Z23 Encounter for immunization: Secondary | ICD-10-CM

## 2013-09-18 DIAGNOSIS — E041 Nontoxic single thyroid nodule: Secondary | ICD-10-CM

## 2013-09-18 DIAGNOSIS — Z6841 Body Mass Index (BMI) 40.0 and over, adult: Secondary | ICD-10-CM

## 2013-09-18 DIAGNOSIS — K2270 Barrett's esophagus without dysplasia: Secondary | ICD-10-CM

## 2013-09-18 DIAGNOSIS — E785 Hyperlipidemia, unspecified: Secondary | ICD-10-CM

## 2013-09-18 DIAGNOSIS — F909 Attention-deficit hyperactivity disorder, unspecified type: Secondary | ICD-10-CM

## 2013-09-18 DIAGNOSIS — R634 Abnormal weight loss: Secondary | ICD-10-CM

## 2013-09-18 DIAGNOSIS — Z0000 Encounter for general adult medical examination without abnormal findings: Secondary | ICD-10-CM

## 2013-09-18 DIAGNOSIS — R1115 Cyclical vomiting syndrome unrelated to migraine: Secondary | ICD-10-CM

## 2013-09-18 DIAGNOSIS — N63 Unspecified lump in unspecified breast: Secondary | ICD-10-CM

## 2013-09-18 DIAGNOSIS — I10 Essential (primary) hypertension: Secondary | ICD-10-CM

## 2013-09-18 DIAGNOSIS — E039 Hypothyroidism, unspecified: Secondary | ICD-10-CM

## 2013-09-18 DIAGNOSIS — M1990 Unspecified osteoarthritis, unspecified site: Secondary | ICD-10-CM

## 2013-09-18 LAB — COMPREHENSIVE METABOLIC PANEL
ALT: 22 U/L (ref 0–35)
AST: 20 U/L (ref 0–37)
Albumin: 3.9 g/dL (ref 3.5–5.2)
Alkaline Phosphatase: 93 U/L (ref 39–117)
BILIRUBIN TOTAL: 0.8 mg/dL (ref 0.2–1.2)
BUN: 9 mg/dL (ref 6–23)
CALCIUM: 8.9 mg/dL (ref 8.4–10.5)
CHLORIDE: 102 meq/L (ref 96–112)
CO2: 27 meq/L (ref 19–32)
CREATININE: 0.78 mg/dL (ref 0.50–1.10)
GLUCOSE: 123 mg/dL — AB (ref 70–99)
Potassium: 3.3 mEq/L — ABNORMAL LOW (ref 3.5–5.3)
Sodium: 141 mEq/L (ref 135–145)
TOTAL PROTEIN: 6.7 g/dL (ref 6.0–8.3)

## 2013-09-18 LAB — CBC WITH DIFFERENTIAL/PLATELET
Basophils Absolute: 0.1 10*3/uL (ref 0.0–0.1)
Basophils Relative: 1 % (ref 0–1)
EOS ABS: 0.2 10*3/uL (ref 0.0–0.7)
EOS PCT: 3 % (ref 0–5)
HEMATOCRIT: 39.6 % (ref 36.0–46.0)
HEMOGLOBIN: 13.7 g/dL (ref 12.0–15.0)
Lymphocytes Relative: 28 % (ref 12–46)
Lymphs Abs: 2 10*3/uL (ref 0.7–4.0)
MCH: 29.9 pg (ref 26.0–34.0)
MCHC: 34.6 g/dL (ref 30.0–36.0)
MCV: 86.5 fL (ref 78.0–100.0)
MONOS PCT: 7 % (ref 3–12)
Monocytes Absolute: 0.5 10*3/uL (ref 0.1–1.0)
Neutro Abs: 4.3 10*3/uL (ref 1.7–7.7)
Neutrophils Relative %: 61 % (ref 43–77)
PLATELETS: 350 10*3/uL (ref 150–400)
RBC: 4.58 MIL/uL (ref 3.87–5.11)
RDW: 14.1 % (ref 11.5–15.5)
WBC: 7 10*3/uL (ref 4.0–10.5)

## 2013-09-18 LAB — LIPID PANEL
Cholesterol: 172 mg/dL (ref 0–200)
HDL: 63 mg/dL (ref >39)
LDL Cholesterol: 90 mg/dL (ref 0–99)
Total CHOL/HDL Ratio: 2.7 Ratio
Triglycerides: 95 mg/dL (ref <150)
VLDL: 19 mg/dL (ref 0–40)

## 2013-09-18 MED ORDER — AMPHETAMINE-DEXTROAMPHETAMINE 20 MG PO TABS: 20.0000 mg | ORAL_TABLET | Freq: Two times a day (BID) | ORAL | Status: DC

## 2013-09-18 MED ORDER — LEVOTHYROXINE SODIUM 100 MCG PO TABS: 100.0000 ug | ORAL_TABLET | Freq: Every day | ORAL | Status: DC

## 2013-09-18 MED ORDER — LOSARTAN POTASSIUM 50 MG PO TABS: 50.0000 mg | ORAL_TABLET | Freq: Every day | ORAL | Status: DC

## 2013-09-18 MED ORDER — PANTOPRAZOLE SODIUM 20 MG PO TBEC: 40.0000 mg | DELAYED_RELEASE_TABLET | Freq: Every day | ORAL | Status: DC

## 2013-09-18 MED ORDER — HYDROCHLOROTHIAZIDE 25 MG PO TABS: 25.0000 mg | ORAL_TABLET | Freq: Every day | ORAL | Status: DC

## 2013-09-18 NOTE — Progress Notes (Signed)
   Subjective:    Patient ID: Christina Hays, female    DOB: 04-12-1946, 68 y.o.   MRN: [id number]  HPI    Review of Systems  HENT: Positive for hearing loss.   Cardiovascular: Positive for leg swelling.  Gastrointestinal: Positive for vomiting and diarrhea.  Psychiatric/Behavioral: Positive for dysphoric mood and decreased concentration.       Objective:   Physical Exam        Assessment & Plan:

## 2013-09-18 NOTE — Progress Notes (Signed)
Subjective:    Patient ID: Christina Hays, female    DOB: 26-May-1946, 68 y.o.   MRN: [id number]  [facility] Patient Active Problem List   Diagnosis Date Noted  . Special screening for malignant neoplasms, colon 03/29/2013  . Barrett's esophagus 09/10/2012  . Thyroid nodule 09/10/2012  . Other and unspecified hyperlipidemia 09/10/2012  . Osteoarthritis 09/10/2012  . BMI 40.0-44.9, adult 09/05/2012  . Hypothyroid 07/27/2011  . HTN (hypertension) 07/27/2011  . ADHD (attention deficit hyperactivity disorder) 07/27/2011  . Cyclic vomiting syndrome 07/27/2011   Current outpatient prescriptions:amphetamine-dextroamphetamine (ADDERALL) 20 MG tablet, Take 1 tablet (20 mg total) by mouth 2 (two) times daily., Disp: 60 tablet, Rfl: 0;  Cholecalciferol (VITAMIN D3) 2000 UNITS capsule, Take 6,000 Units by mouth daily., Disp: , Rfl: ;  hydrochlorothiazide (HYDRODIURIL) 25 MG tablet, Take 1 tablet (25 mg total) by mouth daily., Disp: 90 tablet, Rfl: 3 levothyroxine (SYNTHROID, LEVOTHROID) 100 MCG tablet, Take 1 tablet (100 mcg total) by mouth daily., Disp: 90 tablet, Rfl: 3;  losartan (COZAAR) 50 MG tablet, Take 1 tablet (50 mg total) by mouth daily., Disp: 90 tablet, Rfl: 3;  multivitamin-lutein (OCUVITE-LUTEIN) CAPS capsule, Take 1 capsule by mouth daily., Disp: , Rfl: ;  pantoprazole (PROTONIX) 20 MG tablet, Take 2 tablets (40 mg total) by mouth daily., Disp: 90 tablet, Rfl: 3   Irritability incr/depr often--see scale--still prefers no meds or couns--does well at work and socially  addr still works for add ?wt loss noted-10lb 3 mos--no clear sxtoms connected  Cyclic vom better--recent 4d diarr illness resolved now No bld stool or vom Sub ling zofran works well--2x a week   Still working [place] in October when retir  Review of Systems 14 systems reviewed with pink sheet She still notes fatigue which he thinks is secondary to her frequent depression although she does not want medication or  counseling because she sees herself as doing well Her hearing loss has been greatly improved with a right-sided digital aid and she is considering Surgery for the left ear for an implant She has noted some leg swelling over the last month that is a little more than when she first started with this in 2011 but this is not affecting activity and there is no associated shortness of breath or palpitations    Objective:   Physical Exam  Constitutional: She is oriented to person, place, and time. She appears well-developed and well-nourished.  Overweight  HENT:  Right Ear: External ear normal.  Left Ear: External ear normal.  Nose: Nose normal.  Mouth/Throat: Oropharynx is clear and moist.  Poor teeth Upper plate--two incis decayed lower  Eyes: Conjunctivae and EOM are normal. Pupils are equal, round, and reactive to light.  Neck: Normal range of motion. Neck supple. No thyromegaly present.  I cannot feel her thyroid nodule  Cardiovascular: Normal rate, regular rhythm, normal heart sounds and intact distal pulses.  Exam reveals no gallop.   No murmur heard. No carotid bruit  Pulmonary/Chest: Effort normal and breath sounds normal. She has no wheezes.     Palpable thickness in the left lower outer quadrant of the breast that is slightly tender No axillary adenopathy or nipple discharge No dimpling  Abdominal: Soft. Bowel sounds are normal. She exhibits no mass. There is no tenderness. There is no rebound and no guarding.  No abdominal bruit or organomegaly  Genitourinary:  She defers gynecological exam until next year  Musculoskeletal: Normal range of motion.  Although her legs are puffy, there  is no pitting edema.   Lymphadenopathy:    She has no cervical adenopathy.  Neurological: She is alert and oriented to person, place, and time. She has normal reflexes. No cranial nerve deficit.  Skin: No rash noted. No erythema.  Psychiatric: She has a normal mood and affect. Her behavior is  normal. Judgment and thought content normal.   BP 125/63  Pulse 93  Temp(Src) 97.9 F (36.6 C)  Resp 16  Ht 5' 2.5" (1.588 m)  Wt 235 lb (106.595 kg)  BMI 42.27 kg/m2  SpO2 98%    Wt Readings from Last 3 Encounters:  09/18/13 235 lb (106.595 kg)  08/20/13 239 lb 3.2 oz (108.5 kg)  06/06/13 245 lb (111.131 kg)     Assessment & Plan:  Routine general medical examination at a health care facility Need for prophylactic vaccination against Streptococcus  pneumoniae (pneumococcus) - Plan: Pneumococcal polysaccharide vaccine 23-valent greater than or equal to 2yo  subcutaneous/IM  Will need screening colonoscopy again when directed by Dr. Cathren LaineKaplan  Barrett's esophagus--Dr. Kaplan/stable--- see January 2015  BMI 40.0-44.9, adult  Cyclic vomiting syndrome - continue when necessary Zofran. Which has been asked successful  HTN (hypertension) - labs/no change in medication  Osteoarthritis-stable  Other and unspecified hyperlipidemia - Plan: Lipid panel  Hypothyroid - Plan: TSH, T4, free Thyroid nodule - Plan: levothyroxine (SYNTHROID, LEVOTHROID) 100 MCG tablet//labs  [facility]  Weight loss 10 lbs ?etio--- to follow closely,. Lab screenings today may indicate further imaging  ADD (attention deficit disorder) - Plan: amphetamine-dextroamphetamine (ADDERALL) 20 MG tablet --she has 3/15/ and 4/15 meds---will give 5/15 rx and may call for next 3  Breast mass in female - Plan: MM Digital Diagnostic Unilat L  Meds ordered this encounter  Medications  . losartan (COZAAR) 50 MG tablet    Sig: Take 1 tablet (50 mg total) by mouth daily.    Dispense:  90 tablet    Refill:  3  . levothyroxine (SYNTHROID, LEVOTHROID) 100 MCG tablet    Sig: Take 1 tablet (100 mcg total) by mouth daily.    Dispense:  90 tablet    Refill:  3  . hydrochlorothiazide (HYDRODIURIL) 25 MG tablet    Sig: Take 1 tablet (25 mg total) by mouth daily.    Dispense:  90 tablet     Refill:  3  . pantoprazole (PROTONIX) 20 MG tablet    Sig: Take 2 tablets (40 mg total) by mouth daily.    Dispense:  90 tablet    Refill:  3  . : amphetamine-dextroamphetamine (ADDERALL) 20 MG tablet    Sig: Take 1 tablet (20 mg total) by mouth 2 (two) times daily. Fill 60 days after signing    Dispense:  60 tablet    Refill:  0  .  amphetamine-dextroamphetamine (ADDERALL) 20 MG tablet    Sig: Take 1 tablet (20 mg total) by mouth 2 (two) times daily. For 30 days after signed    Dispense:  60 tablet    Refill:  0  . amphetamine-dextroamphetamine (ADDERALL) 20 MG tablet    Sig: Take 1 tablet (20 mg total) by mouth 2 (two) times daily.    Dispense:  60 tablet    Refill:  0    Order Specific Question:  Supervising Provider    Answer:  Simpson Paulos P [[id number]]

## 2013-09-19 ENCOUNTER — Other Ambulatory Visit: Payer: Self-pay | Admitting: Internal Medicine

## 2013-09-19 DIAGNOSIS — N63 Unspecified lump in unspecified breast: Secondary | ICD-10-CM

## 2013-09-19 DIAGNOSIS — N6459 Other signs and symptoms in breast: Secondary | ICD-10-CM

## 2013-09-19 LAB — TSH: TSH: 2.857 u[IU]/mL (ref 0.350–4.500)

## 2013-09-19 LAB — T4, FREE: FREE T4: 1.54 ng/dL (ref 0.80–1.80)

## 2013-09-23 ENCOUNTER — Encounter: Payer: Self-pay | Admitting: Internal Medicine

## 2013-09-25 ENCOUNTER — Other Ambulatory Visit: Payer: BC Managed Care – PPO

## 2013-09-27 ENCOUNTER — Encounter: Payer: Self-pay | Admitting: Internal Medicine

## 2013-09-28 ENCOUNTER — Other Ambulatory Visit: Payer: Self-pay | Admitting: Internal Medicine

## 2013-10-02 ENCOUNTER — Ambulatory Visit
Admission: RE | Admit: 2013-10-02 | Discharge: 2013-10-02 | Disposition: A | Discharge status: Home / Self Care | Payer: Self-pay | Source: Ambulatory Visit | Attending: Internal Medicine | Admitting: Internal Medicine

## 2013-10-02 DIAGNOSIS — N63 Unspecified lump in unspecified breast: Secondary | ICD-10-CM

## 2013-10-02 DIAGNOSIS — N6459 Other signs and symptoms in breast: Secondary | ICD-10-CM

## 2013-10-02 DIAGNOSIS — N6489 Other specified disorders of breast: Secondary | ICD-10-CM | POA: Diagnosis not present

## 2013-10-23 ENCOUNTER — Telehealth: Payer: Self-pay

## 2013-10-23 NOTE — Telephone Encounter (Signed)
Patient left voicemail for medical records requesting thyroid ultrasounds and biopsies for [facility]Dr Celso Sickleynthia Burns. Called [facility] to see if they have received records because referrals should have sent them when referral was done back in December 2014. The scheduler was not sure since she couldn't get anyone on the phone in that dept. So I faxed ultrasounds and biopsies just in case with confirmation.

## 2013-12-08 ENCOUNTER — Encounter: Payer: Self-pay | Admitting: Internal Medicine

## 2013-12-21 ENCOUNTER — Ambulatory Visit (INDEPENDENT_AMBULATORY_CARE_PROVIDER_SITE_OTHER): Payer: BC Managed Care – PPO | Admitting: Internal Medicine

## 2013-12-21 VITALS — BP 140/78 | HR 81 | Temp 98.2°F | Resp 17 | Ht 62.5 in | Wt 240.4 lb

## 2013-12-21 DIAGNOSIS — Z209 Contact with and (suspected) exposure to unspecified communicable disease: Secondary | ICD-10-CM

## 2013-12-21 NOTE — Progress Notes (Signed)
   Subjective:    Patient ID: Christina Hays, female    DOB: [birth year], 68 y.o.   MRN: [id number]  HPI she awoke this morning and found a dead bat in her bathroom. She was not awakened during the night by any activity or physical sensations. She was not intoxicated. The bat is frozen in her refrigerator awaiting testing for Monday. She would like to be examined for bites in the areas that she cannot see.   Recently evaluated by Dr. Lawerance BachBurns regarding her thyroid nodule and fine needle aspiration is planned for the fall.    Review of Systems There are no other symptoms    Objective:   Physical Exam BP 140/78  Pulse 81  Temp(Src) 98.2 F (36.8 C) (Oral)  Resp 17  Ht 5' 2.5" (1.588 m)  Wt 240 lb 6 oz (109.033 kg)  BMI 43.24 kg/m2  SpO2 95% Posterior aspects of her body are examined in detail and there is no evidence of any skin markings suggestive of the bat bite       Assessment & Plan:  Exposure to bat without known bite  There is no reason to recommend immunization at this time She has done Internet research at the [employer] website and agrees with this recommendation The bat will be evaluated.

## 2013-12-23 ENCOUNTER — Encounter: Payer: Self-pay | Admitting: Internal Medicine

## 2014-06-07 IMAGING — US US THYROID BIOPSY
1 series · 14 of 15 positions shown · non-contrast
Comparison: 09/28/2012

CLINICAL DATA: Dominant right thyroid nodule

ULTRASOUND GUIDED NEEDLE ASPIRATE BIOPSY OF THE THYROID GLAND

[Series 1: us thyroid biopsy · 0.09mm/px · 15 acquisitions, 14 frames shown]
[im 1/15]
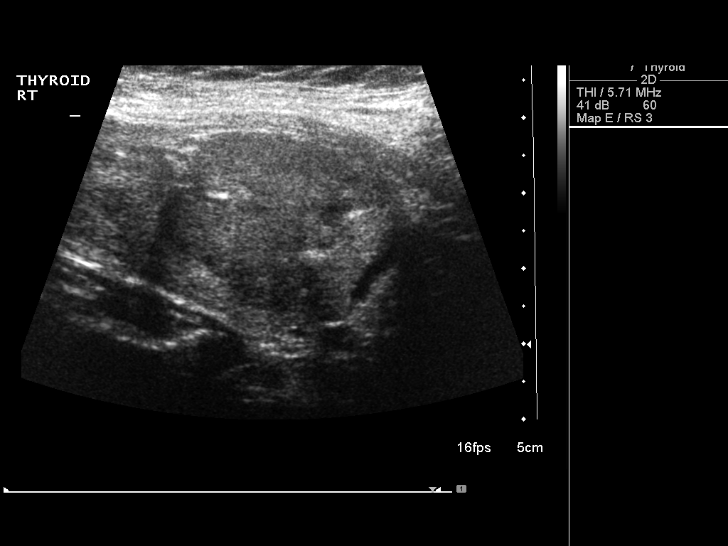
[im 2/15]
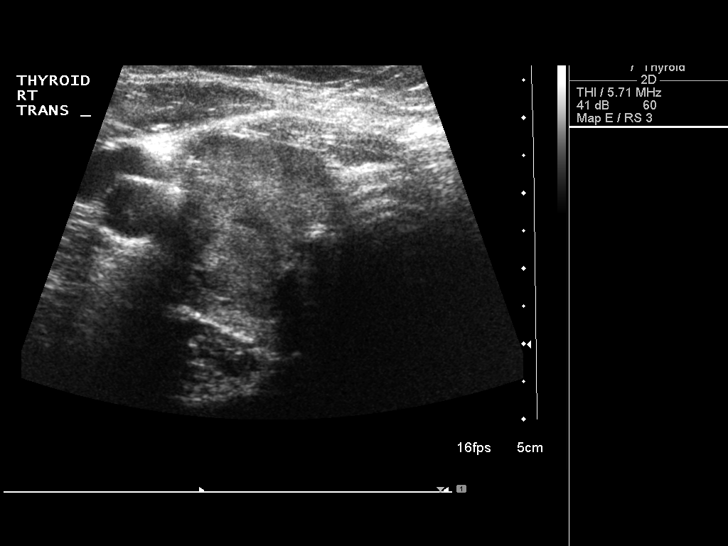
[im 3/15]
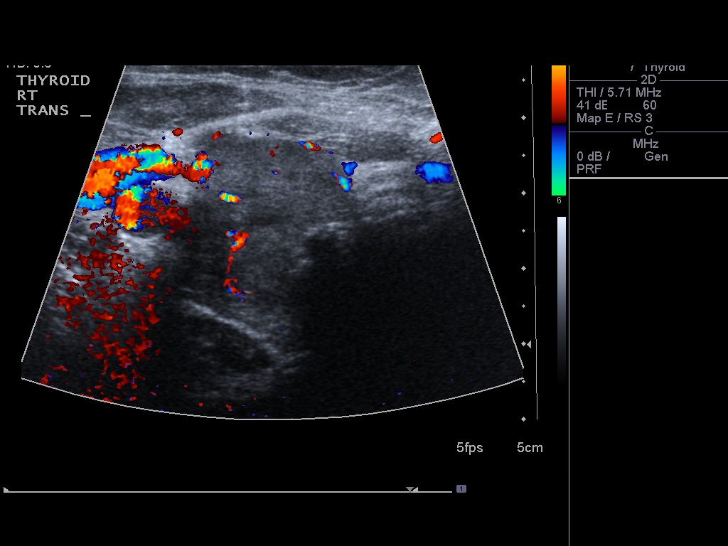
[im 4/15]
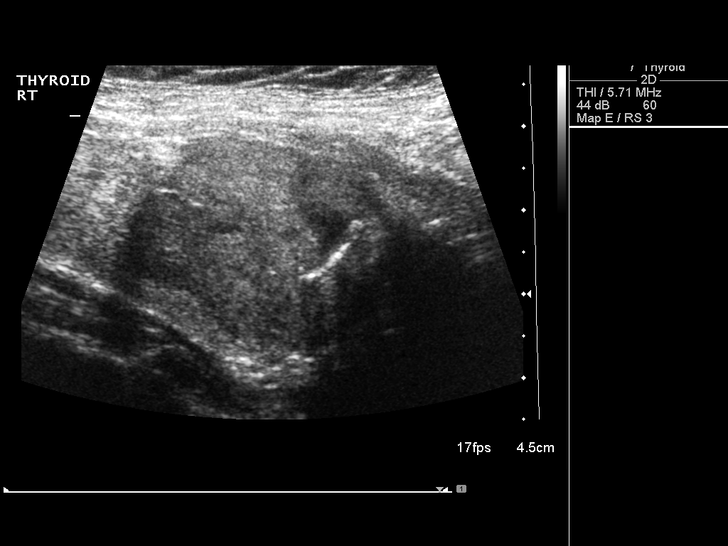
[im 5/15]
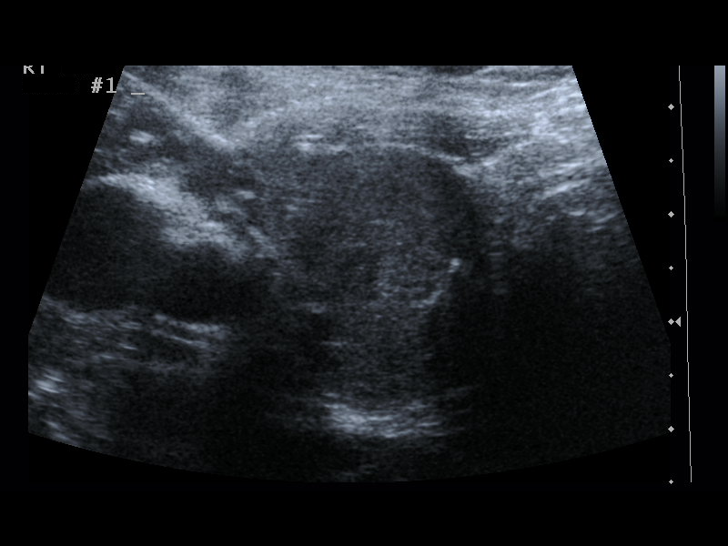
[im 6/15]
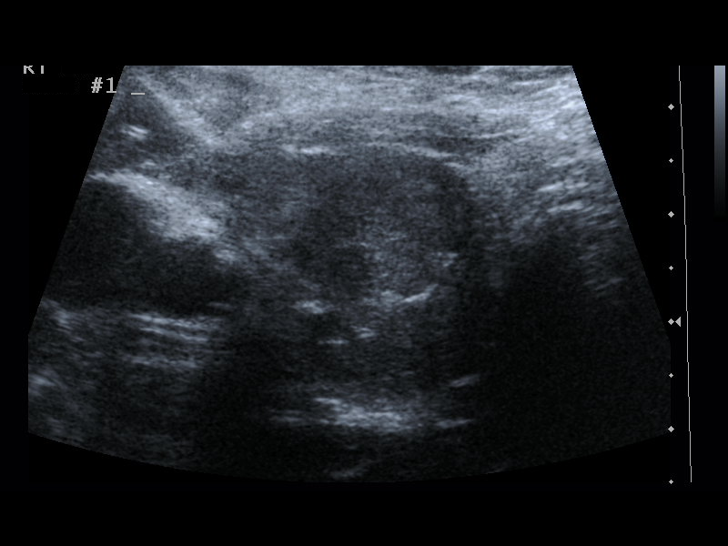
[im 7/15]
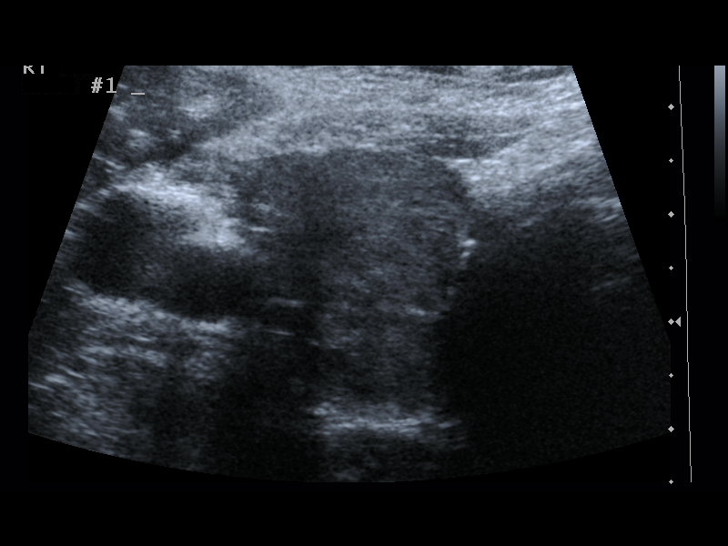
[im 9/15]
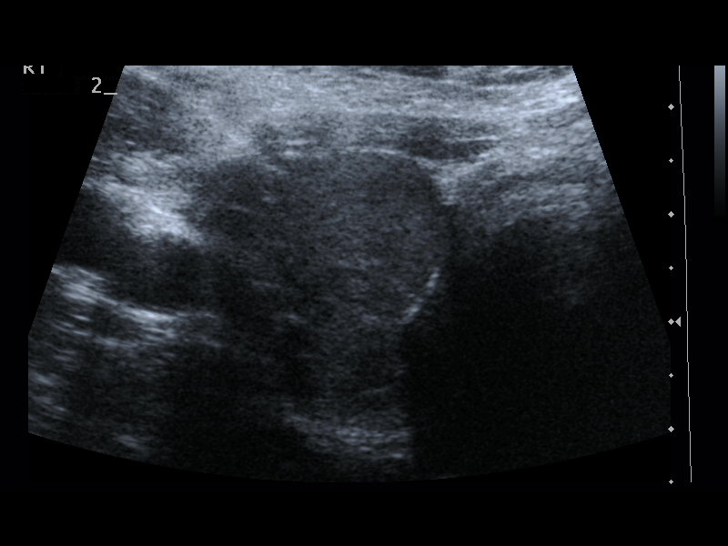
[im 10/15]
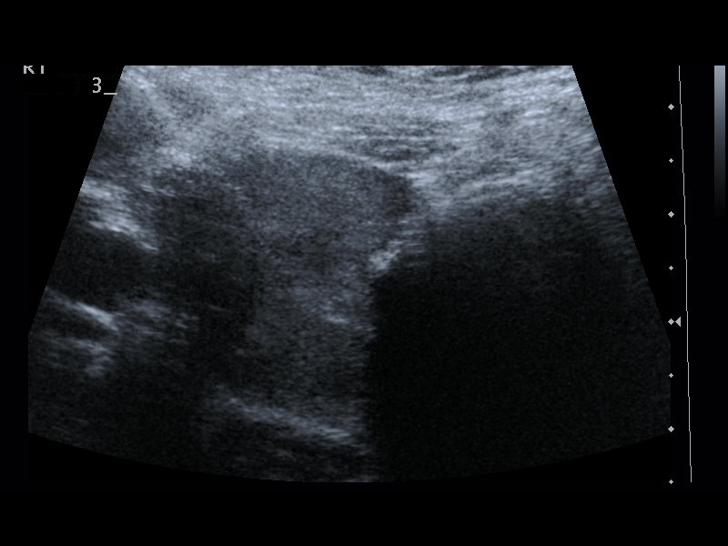
[im 11/15]
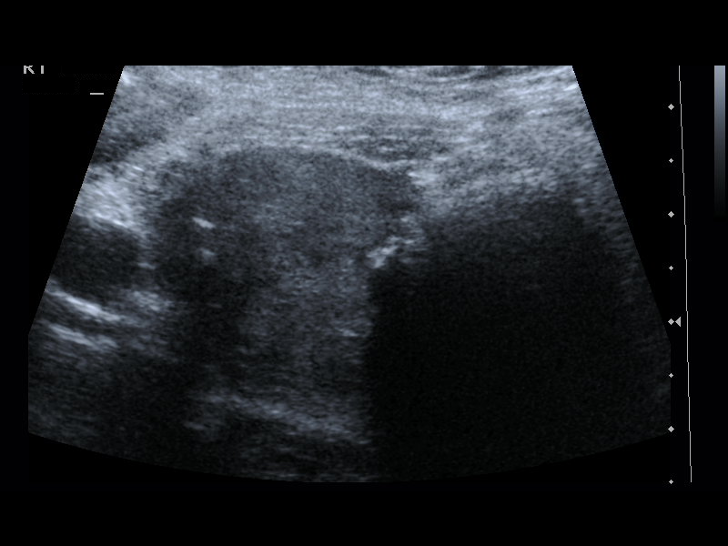
[im 12/15]
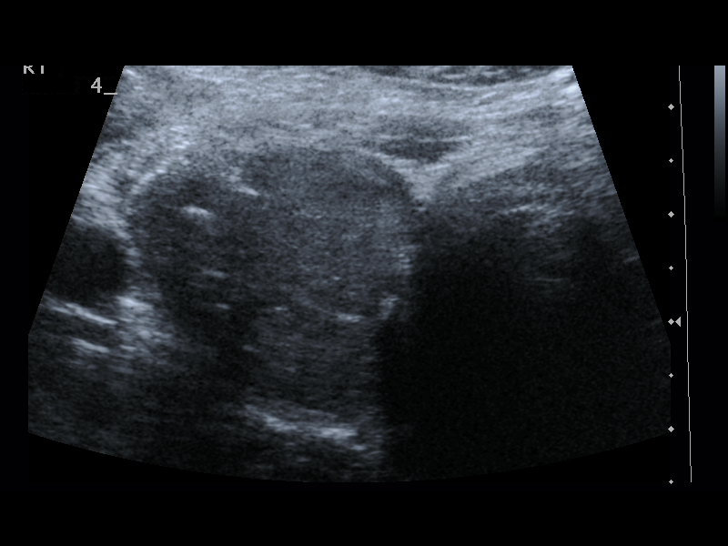
[im 13/15]
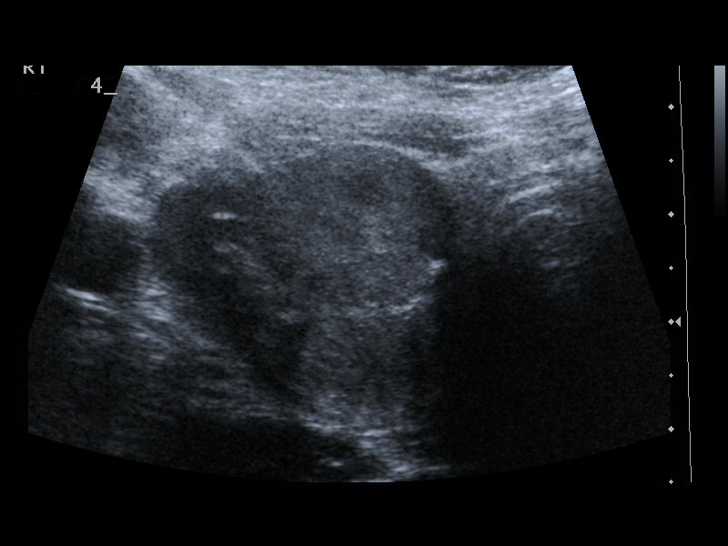
[im 14/15]
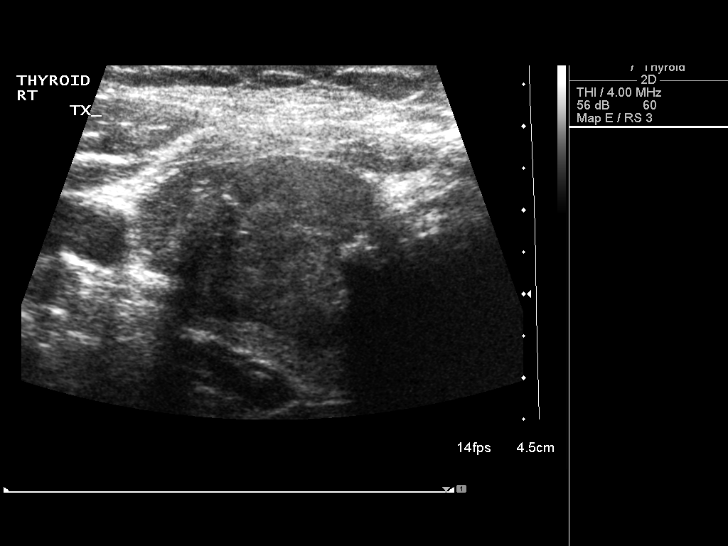
[im 15/15]
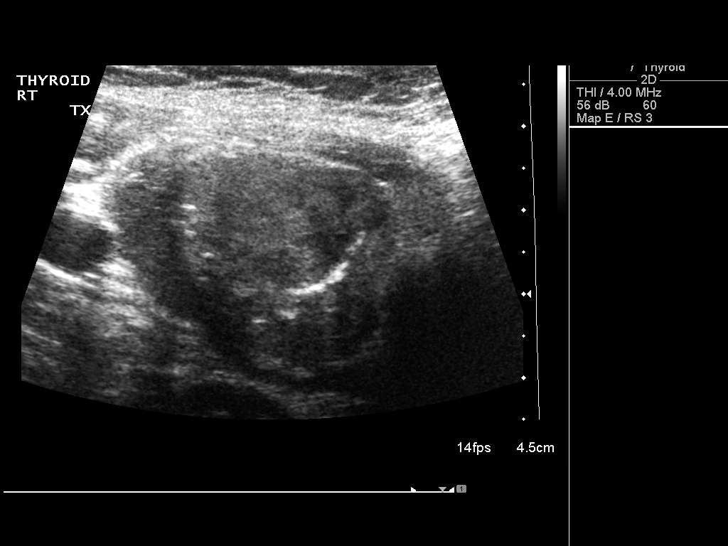

[14 of 15 positions shown; findings below may reference images not displayed]

Thyroid biopsy was thoroughly discussed with the patient and
questions were answered.  The benefits, risks, alternatives, and
complications were also discussed.  The patient understands and
wishes to proceed with the procedure.  Written consent was
obtained.

Ultrasound was performed to localize and mark an adequate site for
the biopsy.  The patient was then prepped and draped in a normal
sterile fashion.  Local anesthesia was provided with 1% lidocaine.
Using direct ultrasound guidance, 4 passes were made using 25 gauge
needles into the nodule within the right lobe of the thyroid.
Ultrasound was used to confirm needle placements on all occasions.
Specimens were sent to Pathology for analysis.

Complications:  No immediate
FINDINGS: Imaging confirms needle placed in the dominant right
thyroid nodule
IMPRESSION: Ultrasound guided needle aspirate biopsy performed of the dominant
right thyroid nodule.

## 2014-06-11 ENCOUNTER — Other Ambulatory Visit: Payer: Self-pay | Admitting: Internal Medicine

## 2014-06-11 DIAGNOSIS — F909 Attention-deficit hyperactivity disorder, unspecified type: Secondary | ICD-10-CM

## 2014-06-11 MED ORDER — AMPHETAMINE-DEXTROAMPHETAMINE 20 MG PO TABS: 20.0000 mg | ORAL_TABLET | Freq: Two times a day (BID) | ORAL | Status: DC

## 2014-06-12 NOTE — Telephone Encounter (Signed)
Notified pt on MyChart, Rx is ready.

## 2014-08-27 ENCOUNTER — Encounter: Payer: Self-pay | Admitting: Internal Medicine

## 2014-08-27 ENCOUNTER — Ambulatory Visit (INDEPENDENT_AMBULATORY_CARE_PROVIDER_SITE_OTHER): Payer: Medicare Other | Admitting: Internal Medicine

## 2014-08-27 VITALS — BP 144/88 | HR 88 | Temp 99.0°F | Resp 18 | Ht 62.25 in | Wt 246.0 lb

## 2014-08-27 DIAGNOSIS — F900 Attention-deficit hyperactivity disorder, predominantly inattentive type: Secondary | ICD-10-CM | POA: Diagnosis not present

## 2014-08-27 DIAGNOSIS — I10 Essential (primary) hypertension: Secondary | ICD-10-CM | POA: Diagnosis not present

## 2014-08-27 DIAGNOSIS — Z0000 Encounter for general adult medical examination without abnormal findings: Secondary | ICD-10-CM

## 2014-08-27 DIAGNOSIS — Z01419 Encounter for gynecological examination (general) (routine) without abnormal findings: Secondary | ICD-10-CM | POA: Diagnosis not present

## 2014-08-27 DIAGNOSIS — Z23 Encounter for immunization: Secondary | ICD-10-CM | POA: Diagnosis not present

## 2014-08-27 DIAGNOSIS — R739 Hyperglycemia, unspecified: Secondary | ICD-10-CM | POA: Diagnosis not present

## 2014-08-27 DIAGNOSIS — Z124 Encounter for screening for malignant neoplasm of cervix: Secondary | ICD-10-CM | POA: Diagnosis not present

## 2014-08-27 DIAGNOSIS — E038 Other specified hypothyroidism: Secondary | ICD-10-CM | POA: Diagnosis not present

## 2014-08-27 LAB — CBC WITH DIFFERENTIAL/PLATELET
BASOS PCT: 1 % (ref 0–1)
Basophils Absolute: 0.1 10*3/uL (ref 0.0–0.1)
EOS ABS: 0.2 10*3/uL (ref 0.0–0.7)
Eosinophils Relative: 2 % (ref 0–5)
HEMATOCRIT: 43.2 % (ref 36.0–46.0)
Hemoglobin: 14.7 g/dL (ref 12.0–15.0)
Lymphocytes Relative: 31 % (ref 12–46)
Lymphs Abs: 2.6 10*3/uL (ref 0.7–4.0)
MCH: 30.3 pg (ref 26.0–34.0)
MCHC: 34 g/dL (ref 30.0–36.0)
MCV: 89.1 fL (ref 78.0–100.0)
MPV: 10.7 fL (ref 8.6–12.4)
Monocytes Absolute: 0.6 10*3/uL (ref 0.1–1.0)
Monocytes Relative: 7 % (ref 3–12)
Neutro Abs: 5 10*3/uL (ref 1.7–7.7)
Neutrophils Relative %: 59 % (ref 43–77)
Platelets: 369 10*3/uL (ref 150–400)
RBC: 4.85 MIL/uL (ref 3.87–5.11)
RDW: 13.8 % (ref 11.5–15.5)
WBC: 8.5 10*3/uL (ref 4.0–10.5)

## 2014-08-27 LAB — POCT URINALYSIS DIPSTICK
BILIRUBIN UA: NEGATIVE
GLUCOSE UA: NEGATIVE
Ketones, UA: NEGATIVE
NITRITE UA: NEGATIVE
Protein, UA: 30
Spec Grav, UA: 1.02
Urobilinogen, UA: 0.2
pH, UA: 7

## 2014-08-27 LAB — TSH: TSH: 3.321 u[IU]/mL (ref 0.350–4.500)

## 2014-08-27 LAB — POCT GLYCOSYLATED HEMOGLOBIN (HGB A1C): HEMOGLOBIN A1C: 5.7

## 2014-08-27 LAB — T4, FREE: Free T4: 1.42 ng/dL (ref 0.80–1.80)

## 2014-08-27 MED ORDER — AMPHETAMINE-DEXTROAMPHETAMINE 20 MG PO TABS: 20.0000 mg | ORAL_TABLET | Freq: Two times a day (BID) | ORAL | Status: DC

## 2014-08-27 NOTE — Progress Notes (Signed)
Subjective:    Patient ID: Christina Hays, female    DOB: [birth year], 69 y.o.   MRN: [id number]  HPI welcome to medicare pE and annual exam with ongoing problems: Patient Active Problem List   Diagnosis Date Noted  . Barrett's esophagus--Dr Deatra Ina 09/10/2012  . Thyroid nodules--Dr Quay Burow to repeat FNA this year 09/10/2012  . Other and unspecified hyperlipidemia--needs labs 09/10/2012  . Osteoarthritis---stable 09/10/2012  . BMI 40.0-44.9, adult---not losing 09/05/2012  . Hypothyroid---needs labs/asympto 07/27/2011  . HTN (hypertension)--no sxt 07/27/2011  . ADHD (attention deficit hyperactivity disorder)-still responds well to meds/less needed now retired 07/27/2011  . 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 She does have hearing impairment but this is cared for with aide Advance directives were discussed and as noted Once again we are addressing diet and increase activity as a means of reducing her BMI I'm sure this is making her osteoarthritis symptoms worse She is never been a smoker Has health insurance exact if she is well aware of the covered Medicare preventive services  Family History  Problem Relation Age of Onset  . Liver cancer Maternal Grandfather   . Colon cancer Maternal Grandmother   . Hypertension Maternal Grandmother   . Thyroid disease Maternal Grandmother   . Colon polyps Mother   . Emphysema Mother   . Thyroid disease Mother   . Emphysema Father   . Stroke Father   . Asthma Father   . Esophageal cancer Neg Hx   . Stomach cancer Neg Hx   . Rectal cancer  Neg Hx      Review of Systems  Constitutional: Negative.   HENT: Positive for dental problem, hearing loss and tinnitus.   Eyes: Positive for visual disturbance.  Respiratory: Negative.   Cardiovascular: Positive for leg swelling.  Gastrointestinal: Negative.   Endocrine: Negative.   Genitourinary: Negative.   Musculoskeletal: Negative.   Skin: Negative.   Allergic/Immunologic: Negative.   Neurological: Negative.   Hematological: Negative.   Psychiatric/Behavioral: Positive for dysphoric mood.  none of these sxt are new--all old and stable     Objective:   Physical Exam  Constitutional: She is oriented to person, place, and time. She appears well-developed and well-nourished. No distress.  Obviously overweight  HENT:  Head: Normocephalic.  Right Ear: External ear normal.  Left Ear: External ear normal.  Nose: Nose normal.  Mouth/Throat: Oropharynx is clear and moist.  Eyes: Conjunctivae and EOM are normal. Pupils are equal, round, and reactive to light.  Neck: Normal range of motion. Neck supple. No thyromegaly present.  Thyroid nodules are not palpable to me  Cardiovascular: Normal rate, regular rhythm, normal heart sounds and intact distal pulses.   No murmur heard. Pulmonary/Chest: Effort normal and breath sounds normal. She has no wheezes.  Abdominal: Soft. Bowel sounds are normal. She exhibits no distension and no mass. There is no tenderness. There is no rebound.  Genitourinary:  Introitus is clear Vaginal vault clear The osseous is friable  There are no uterine enlargement or adnexal masses  Musculoskeletal: Normal range of motion. She exhibits edema. She exhibits no tenderness.  She continues  to have chronic nonpitting peripheral edema although peripheral pulses are full  Lymphadenopathy:    She has no cervical adenopathy.  Neurological: She is alert and oriented to person, place, and time. She has normal reflexes. No cranial nerve deficit. Coordination normal.   Skin: Skin is warm and dry. No rash noted.  Psychiatric: She has a normal mood and affect. Her behavior is normal. Judgment and thought content normal.  Nursing note and vitals reviewed.  BP 144/88 mmHg  Pulse 88  Temp(Src) 99 F (37.2 C) (Oral)  Resp 18  Ht 5' 2.25" (1.581 m)  Wt 246 lb (111.585 kg)  BMI 44.64 kg/m2  SpO2 97%  Wt Readings from Last 3 Encounters:  08/27/14 246 lb (111.585 kg)  12/21/13 240 lb 6 oz (109.033 kg)  09/18/13 235 lb (106.595 kg)         Assessment & Plan:  Need for prophylactic vaccination and inoculation against influenza   Other specified hypothyroidism - Plan: T4, free, TSH  Essential hypertension - Plan: CBC with Differential/Platelet, COMPLETE METABOLIC PANEL WITH GFR, Lipid panel, POCT urinalysis dipstick  Hyperglycemia in past - Plan: POCT glycosylated hemoglobin (Hb A1C)  Need for prophylactic vaccination with Streptococcus pneumoniae (Pneumococcus) and Influenza vaccines - Plan: Pneumococcal conjugate vaccine 13-valent IM  Screening for cervical cancer - Plan: Pap IG and HPV (high risk) DNA detection  Attention deficit hyperactivity disorder (ADHD), predominantly inattentive type - Plan: amphetamine-dextroamphetamine (ADDERALL) 20 MG tablet  Hyperlipidemia--recheck labs  Meds ordered this encounter  Medications  . amphetamine-dextroamphetamine (ADDERALL) 20 MG tablet    Sig: Take 1 tablet (20 mg total) by mouth 2 (two) times daily.    Dispense:  60 tablet    Refill:  0  . amphetamine-dextroamphetamine (ADDERALL) 20 MG tablet    Sig: Take 1 tablet (20 mg total) by mouth 2 (two) times daily. For after 30d from signing    Dispense:  60 tablet    Refill:  0  . amphetamine-dextroamphetamine (ADDERALL) 20 MG tablet    Sig: Take 1 tablet (20 mg total) by mouth 2 (two) times daily. For after 60 d from signing    Dispense:  60 tablet    Refill:  0   Addendum-3/6 Labs Results for orders placed or performed in visit on 08/27/14    CBC with Differential/Platelet  Result Value Ref Range   WBC 8.5 4.0 - 10.5 K/uL   RBC 4.85 3.87 - 5.11 MIL/uL   Hemoglobin 14.7 12.0 - 15.0 g/dL   HCT 43.2 36.0 - 46.0 %   MCV 89.1 78.0 - 100.0 fL   MCH 30.3 26.0 - 34.0 pg   MCHC 34.0 30.0 - 36.0 g/dL   RDW 13.8 11.5 - 15.5 %   Platelets 369 150 - 400 K/uL   MPV 10.7 8.6 - 12.4 fL   Neutrophils Relative % 59 43 - 77 %   Neutro Abs 5.0 1.7 - 7.7 K/uL   Lymphocytes Relative 31 12 - 46 %   Lymphs Abs 2.6 0.7 - 4.0 K/uL   Monocytes Relative 7 3 - 12 %   Monocytes Absolute 0.6 0.1 - 1.0 K/uL   Eosinophils Relative 2 0 - 5 %   Eosinophils Absolute 0.2 0.0 - 0.7 K/uL   Basophils Relative 1 0 - 1 %   Basophils Absolute 0.1 0.0 - 0.1 K/uL   Smear Review Criteria for review not met   COMPLETE METABOLIC PANEL WITH GFR  Result Value Ref Range   Sodium 137  135 - 145 mEq/L   Potassium 3.4 (L) 3.5 - 5.3 mEq/L   Chloride 97 96 - 112 mEq/L   CO2 27 19 - 32 mEq/L   Glucose, Bld 117 (H) 70 - 99 mg/dL   BUN 10 6 - 23 mg/dL   Creat 0.96 0.50 - 1.10 mg/dL   Total Bilirubin 1.1 0.2 - 1.2 mg/dL   Alkaline Phosphatase 90 39 - 117 U/L   AST 39 (H) 0 - 37 U/L   ALT 35 0 - 35 U/L   Total Protein 7.2 6.0 - 8.3 g/dL   Albumin 4.2 3.5 - 5.2 g/dL   Calcium 10.0 8.4 - 10.5 mg/dL   GFR, Est African American 70 mL/min   GFR, Est Non African American 61 mL/min  Lipid panel  Result Value Ref Range   Cholesterol 221 (H) 0 - 200 mg/dL   Triglycerides 122 <150 mg/dL   HDL 74 >=46 mg/dL   Total CHOL/HDL Ratio 3.0 Ratio   VLDL 24 0 - 40 mg/dL   LDL Cholesterol 123 (H) 0 - 99 mg/dL  T4, free  Result Value Ref Range   Free T4 1.42 0.80 - 1.80 ng/dL  TSH  Result Value Ref Range   TSH 3.321 0.350 - 4.500 uIU/mL  POCT urinalysis dipstick  Result Value Ref Range   Color, UA yellow    Clarity, UA clear    Glucose, UA neg    Bilirubin, UA neg    Ketones, UA neg    Spec Grav, UA 1.020    Blood, UA small    pH, UA 7.0    Protein, UA 30     Urobilinogen, UA 0.2    Nitrite, UA neg    Leukocytes, UA large (3+)   POCT glycosylated hemoglobin (Hb A1C)  Result Value Ref Range   Hemoglobin A1C 5.7   Pap IG and HPV (high risk) DNA detection  Result Value Ref Range   HPV DNA High Risk Not Detected    Specimen adequacy: SEE NOTE    FINAL DIAGNOSIS: SEE NOTE    Cytotechnologist: SEE NOTE

## 2014-08-28 LAB — COMPLETE METABOLIC PANEL WITH GFR
ALT: 35 U/L (ref 0–35)
AST: 39 U/L — ABNORMAL HIGH (ref 0–37)
Albumin: 4.2 g/dL (ref 3.5–5.2)
Alkaline Phosphatase: 90 U/L (ref 39–117)
BILIRUBIN TOTAL: 1.1 mg/dL (ref 0.2–1.2)
BUN: 10 mg/dL (ref 6–23)
CO2: 27 meq/L (ref 19–32)
Calcium: 10 mg/dL (ref 8.4–10.5)
Chloride: 97 mEq/L (ref 96–112)
Creat: 0.96 mg/dL (ref 0.50–1.10)
GFR, Est African American: 70 mL/min
GFR, Est Non African American: 61 mL/min
Glucose, Bld: 117 mg/dL — ABNORMAL HIGH (ref 70–99)
Potassium: 3.4 mEq/L — ABNORMAL LOW (ref 3.5–5.3)
Sodium: 137 mEq/L (ref 135–145)
Total Protein: 7.2 g/dL (ref 6.0–8.3)

## 2014-08-28 LAB — LIPID PANEL
Cholesterol: 221 mg/dL — ABNORMAL HIGH (ref 0–200)
HDL: 74 mg/dL (ref >=46)
LDL CALC: 123 mg/dL — AB (ref 0–99)
Total CHOL/HDL Ratio: 3 Ratio
Triglycerides: 122 mg/dL (ref <150)
VLDL: 24 mg/dL (ref 0–40)

## 2014-08-28 MED ORDER — ONDANSETRON HCL 4 MG PO TABS
4.0000 mg | ORAL_TABLET | Freq: Four times a day (QID) | ORAL | Status: DC
Start: 2014-08-28 — End: 2015-04-27

## 2014-08-29 LAB — PAP IG AND HPV HIGH-RISK: HPV DNA HIGH RISK: NOT DETECTED

## 2014-08-29 NOTE — Telephone Encounter (Signed)
Faxed Rx

## 2014-12-16 ENCOUNTER — Telehealth: Payer: Self-pay

## 2014-12-16 DIAGNOSIS — E041 Nontoxic single thyroid nodule: Secondary | ICD-10-CM

## 2014-12-16 DIAGNOSIS — I10 Essential (primary) hypertension: Secondary | ICD-10-CM

## 2014-12-16 MED ORDER — HYDROCHLOROTHIAZIDE 25 MG PO TABS: 25.0000 mg | ORAL_TABLET | Freq: Every day | ORAL | Status: DC

## 2014-12-16 MED ORDER — LOSARTAN POTASSIUM 50 MG PO TABS: 50.0000 mg | ORAL_TABLET | Freq: Every day | ORAL | Status: DC

## 2014-12-16 MED ORDER — LEVOTHYROXINE SODIUM 100 MCG PO TABS
100.0000 ug | ORAL_TABLET | Freq: Every day | ORAL | Status: DC
Start: 2014-12-16 — End: 2015-04-07

## 2014-12-16 NOTE — Telephone Encounter (Signed)
Pt has called her pharmacy to ask for a refill. She would like a refill on her hydrochlorothiazide (HYDRODIURIL) 25 MG tablet [[id number]], levothyroxine (SYNTHROID, LEVOTHROID) 100 MCG tablet [[id number]] and losartan (COZAAR) 50 MG tablet [[id number]] . She says they haven't gotten the request yet. She uses the pharmacy [phone] at [facility]. Please advise at [phone]

## 2014-12-16 NOTE — Telephone Encounter (Signed)
LMOM that RFs were all sent to pharm. Hadn't received any reqs from the pharmacy.

## 2014-12-17 ENCOUNTER — Other Ambulatory Visit: Payer: Self-pay | Admitting: Internal Medicine

## 2015-01-16 ENCOUNTER — Encounter: Payer: Self-pay | Admitting: Internal Medicine

## 2015-01-16 ENCOUNTER — Telehealth: Payer: Self-pay

## 2015-01-16 DIAGNOSIS — E041 Nontoxic single thyroid nodule: Secondary | ICD-10-CM

## 2015-01-16 NOTE — Telephone Encounter (Signed)
Patient of Dr. Merla Riches. She states that her thyroid is larger than usual. She can actually feel it in the front of her neck. She wants to know if Dr. Merla Riches will order a scan of her neck. Does she need to come in? [facility] hours already given to patient for Friday (today) and Sunday. Patient states she has to work today and tomorrow. She will also send him a message thru Mychart as well. Cb# [phone].

## 2015-01-17 NOTE — Telephone Encounter (Signed)
I have placed an order for repeat ultrasound

## 2015-01-19 NOTE — Telephone Encounter (Signed)
Spoke with pt, advised referral put in. 

## 2015-01-23 ENCOUNTER — Ambulatory Visit
Admission: RE | Admit: 2015-01-23 | Discharge: 2015-01-23 | Disposition: A | Discharge status: Home / Self Care | Payer: Medicare Other | Source: Ambulatory Visit | Attending: Internal Medicine | Admitting: Internal Medicine

## 2015-01-23 DIAGNOSIS — E041 Nontoxic single thyroid nodule: Secondary | ICD-10-CM

## 2015-01-24 ENCOUNTER — Encounter: Payer: Self-pay | Admitting: Internal Medicine

## 2015-02-12 ENCOUNTER — Encounter: Payer: Self-pay | Admitting: Internal Medicine

## 2015-02-12 ENCOUNTER — Other Ambulatory Visit: Payer: Self-pay

## 2015-02-12 DIAGNOSIS — Z1231 Encounter for screening mammogram for malignant neoplasm of breast: Secondary | ICD-10-CM

## 2015-02-12 DIAGNOSIS — H9193 Unspecified hearing loss, bilateral: Secondary | ICD-10-CM

## 2015-02-12 NOTE — Telephone Encounter (Signed)
Patient has an appointment on 03/05/15 at [facility] at [facility]. Their office is requesting a referral to be sent since patient is on medicare to attn: Lorie [phone]. Patients call back number is [phone]

## 2015-02-13 NOTE — Telephone Encounter (Signed)
Pt called and left a message with out referrals department to have the [facility] done and they will need this referral sent by 03/05/15.  Pt wanted to make sure that this was taken care of before that time.

## 2015-02-13 NOTE — Telephone Encounter (Signed)
Dr Merla Riches ordered referral.

## 2015-02-17 DIAGNOSIS — H3531 Nonexudative age-related macular degeneration: Secondary | ICD-10-CM | POA: Diagnosis not present

## 2015-02-17 DIAGNOSIS — H2512 Age-related nuclear cataract, left eye: Secondary | ICD-10-CM | POA: Diagnosis not present

## 2015-02-17 DIAGNOSIS — H2511 Age-related nuclear cataract, right eye: Secondary | ICD-10-CM | POA: Diagnosis not present

## 2015-02-17 DIAGNOSIS — H3532 Exudative age-related macular degeneration: Secondary | ICD-10-CM | POA: Diagnosis not present

## 2015-02-19 ENCOUNTER — Ambulatory Visit: Payer: Medicare Other

## 2015-02-20 DIAGNOSIS — H3532 Exudative age-related macular degeneration: Secondary | ICD-10-CM | POA: Diagnosis not present

## 2015-03-05 DIAGNOSIS — H9072 Mixed conductive and sensorineural hearing loss, unilateral, left ear, with unrestricted hearing on the contralateral side: Secondary | ICD-10-CM | POA: Diagnosis not present

## 2015-03-05 DIAGNOSIS — Z448 Encounter for fitting and adjustment of other external prosthetic devices: Secondary | ICD-10-CM | POA: Diagnosis not present

## 2015-03-05 DIAGNOSIS — Z974 Presence of external hearing-aid: Secondary | ICD-10-CM | POA: Diagnosis not present

## 2015-03-05 DIAGNOSIS — H9041 Sensorineural hearing loss, unilateral, right ear, with unrestricted hearing on the contralateral side: Secondary | ICD-10-CM | POA: Diagnosis not present

## 2015-03-05 DIAGNOSIS — H9193 Unspecified hearing loss, bilateral: Secondary | ICD-10-CM | POA: Diagnosis not present

## 2015-03-30 DIAGNOSIS — H353221 Exudative age-related macular degeneration, left eye, with active choroidal neovascularization: Secondary | ICD-10-CM | POA: Diagnosis not present

## 2015-03-30 DIAGNOSIS — H353211 Exudative age-related macular degeneration, right eye, with active choroidal neovascularization: Secondary | ICD-10-CM | POA: Diagnosis not present

## 2015-04-02 DIAGNOSIS — H353221 Exudative age-related macular degeneration, left eye, with active choroidal neovascularization: Secondary | ICD-10-CM | POA: Diagnosis not present

## 2015-04-07 ENCOUNTER — Other Ambulatory Visit: Payer: Self-pay | Admitting: Internal Medicine

## 2015-04-07 DIAGNOSIS — H02839 Dermatochalasis of unspecified eye, unspecified eyelid: Secondary | ICD-10-CM | POA: Diagnosis not present

## 2015-04-07 DIAGNOSIS — H2512 Age-related nuclear cataract, left eye: Secondary | ICD-10-CM | POA: Diagnosis not present

## 2015-04-07 DIAGNOSIS — H35029 Exudative retinopathy, unspecified eye: Secondary | ICD-10-CM | POA: Diagnosis not present

## 2015-04-07 DIAGNOSIS — H2511 Age-related nuclear cataract, right eye: Secondary | ICD-10-CM | POA: Diagnosis not present

## 2015-04-07 DIAGNOSIS — H18411 Arcus senilis, right eye: Secondary | ICD-10-CM | POA: Diagnosis not present

## 2015-04-27 ENCOUNTER — Ambulatory Visit (INDEPENDENT_AMBULATORY_CARE_PROVIDER_SITE_OTHER): Payer: Medicare Other | Admitting: Internal Medicine

## 2015-04-27 VITALS — BP 92/66 | HR 88 | Temp 98.6°F | Resp 18 | Ht 62.0 in | Wt 242.4 lb

## 2015-04-27 DIAGNOSIS — F900 Attention-deficit hyperactivity disorder, predominantly inattentive type: Secondary | ICD-10-CM

## 2015-04-27 DIAGNOSIS — E041 Nontoxic single thyroid nodule: Secondary | ICD-10-CM

## 2015-04-27 DIAGNOSIS — F411 Generalized anxiety disorder: Secondary | ICD-10-CM

## 2015-04-27 DIAGNOSIS — H3530 Unspecified macular degeneration: Secondary | ICD-10-CM | POA: Diagnosis not present

## 2015-04-27 DIAGNOSIS — I10 Essential (primary) hypertension: Secondary | ICD-10-CM

## 2015-04-27 MED ORDER — LOSARTAN POTASSIUM 50 MG PO TABS: ORAL_TABLET | ORAL | Status: DC

## 2015-04-27 MED ORDER — ALPRAZOLAM 0.25 MG PO TABS: 0.2500 mg | ORAL_TABLET | Freq: Two times a day (BID) | ORAL | Status: DC | PRN

## 2015-04-27 MED ORDER — AMPHETAMINE-DEXTROAMPHETAMINE 20 MG PO TABS: 20.0000 mg | ORAL_TABLET | Freq: Two times a day (BID) | ORAL | Status: DC

## 2015-04-27 MED ORDER — LEVOTHYROXINE SODIUM 100 MCG PO TABS: 100.0000 ug | ORAL_TABLET | Freq: Every day | ORAL | Status: DC

## 2015-04-27 MED ORDER — HYDROCHLOROTHIAZIDE 25 MG PO TABS: ORAL_TABLET | ORAL | Status: DC

## 2015-04-27 NOTE — Progress Notes (Signed)
Subjective:  This chart was scribed for Ellamae Siaobert Gladstone Rosas, MD by Andrew Auaven Small, ED Scribe. This patient was seen in room 2 and the patient's care was started at 4:05 PM.   Patient ID: Christina Hays, female    DOB: April 03, 1946, 69 y.o.   MRN: [id number]  HPI   Chief Complaint  Patient presents with  . Medication Refill    Adderall   HPI Comments: Christina Hays is a 69 y.o. female who presents to the [facility] for a medication refill of HCTZ, losartan, levothyroxine and adderall.   She is receiving injection in both eyes for new diagnosis of macular degeneration, initially in right eye, now in both, followed by Dr. Luciana Axeankin. She is going for a second opinion to [facility], to become more educated on this diagnosis. She is concerned due to her mother going blind shortly after being diagnosed with macular degeneration.  She is having a problem with panic attacks, noticed more while she is at home vs than when at work. She had a 2 panic attacks 3 days ago while packing to go visit her family. She had another while visiting her family, when told they were going to church, which stressed her. She finds that adderall helps. She cannot take adderall past 2-3pm, wont be able to sleep.    She started drinking Gatorade instead of taking potassium supplement.   Appointment with Dr. Joelene Millinliver- ear surgery, at the end of January. No relief with ear hearing aid.   Patient Active Problem List   Diagnosis Date Noted  . Macular degeneration 04/28/2015  . Barrett's esophagus 09/10/2012  . Thyroid nodule 09/10/2012  . Other and unspecified hyperlipidemia 09/10/2012  . Osteoarthritis 09/10/2012  . BMI 40.0-44.9, adult (HCC) 09/05/2012  . Hypothyroid 07/27/2011  . HTN (hypertension) 07/27/2011  . ADHD (attention deficit hyperactivity disorder) 07/27/2011  . Cyclic vomiting syndrome 07/27/2011    -  Hearing loss  Prior to Admission medications   Medication Sig  Start Date End Date Taking? Authorizing Provider  amphetamine-dextroamphetamine (ADDERALL) 20 MG tablet Take 1 tablet (20 mg total) by mouth 2 (two) times daily. For after 30d from signing 08/27/14  Yes Tonye Pearsonobert P Amonda Brillhart, MD  amphetamine-dextroamphetamine (ADDERALL) 20 MG tablet Take 1 tablet (20 mg total) by mouth 2 (two) times daily. For after 60 d from signing 08/27/14  Yes Tonye Pearsonobert P Zong Mcquarrie, MD  Cholecalciferol (VITAMIN D3) 2000 UNITS capsule Take 6,000 Units by mouth daily.   Yes Historical Provider, MD  hydrochlorothiazide (HYDRODIURIL) 25 MG tablet TAKE ONE TABLET BY MOUTH ONCE DAILY.  "OFFICE VISIT NEEDED FOR REFILLS" 04/08/15  Yes Chelle Jeffery, PA-C  levothyroxine (SYNTHROID, LEVOTHROID) 100 MCG tablet TAKE ONE TABLET BY MOUTH ONCE DAILY  "OFFICE VISIT NEEDED FOR REFILLS" 04/08/15  Yes Chelle Jeffery, PA-C  losartan (COZAAR) 50 MG tablet TAKE ONE TABLET BY MOUTH ONCE DAILY.  "NEEDS OFFICE VISIT FOR REFILLS" 04/08/15  Yes Chelle Jeffery, PA-C  multivitamin-lutein (OCUVITE-LUTEIN) CAPS capsule Take 1 capsule by mouth daily.   Yes Historical Provider, MD   Review of Systems  Constitutional: Negative for unexpected weight change.  HENT: Negative for trouble swallowing.   Eyes: Negative for visual disturbance.  Respiratory: Negative for chest tightness and shortness of breath.   Cardiovascular: Positive for leg swelling. Negative for chest pain and palpitations.       No new leg edema  Psychiatric/Behavioral: The patient is nervous/anxious.    Objective:   Physical Exam  Constitutional: She is oriented to person, place, and time. She appears well-developed and well-nourished. No distress.  HENT:  Head: Normocephalic and atraumatic.  Eyes: Conjunctivae and EOM are normal.  Neck: Neck supple.  Cardiovascular: Normal rate.   Pulmonary/Chest: Effort normal.  Musculoskeletal: Normal range of motion.  Neurological: She is alert and oriented to person, place, and time.  Skin: Skin is warm  and dry.  Psychiatric: She has a normal mood and affect. Her behavior is normal.  Nursing note and vitals reviewed.  Filed Vitals:   04/27/15 1546  BP: 92/66  Pulse: 88  Temp: 98.6 F (37 C)  TempSrc: Oral  Resp: 18  Height:  (1.575 m)  Weight: 242 lb 6.4 oz (109.952 kg)  SpO2: 98%   Assessment & Plan:   1. Attention deficit hyperactivity disorder (ADHD), predominantly inattentive type   2. Macular degeneration   3. Essential hypertension   4. Anxiety state   5. Thyroid nodule     Meds ordered this encounter  Medications  . losartan (COZAAR) 50 MG tablet    Sig: TAKE ONE TABLET BY MOUTH ONCE DAILY.    Dispense:  90 tablet    Refill:  1  . levothyroxine (SYNTHROID, LEVOTHROID) 100 MCG tablet    Sig: Take 1 tablet (100 mcg total) by mouth daily before breakfast.    Dispense:  90 tablet    Refill:  1  . hydrochlorothiazide (HYDRODIURIL) 25 MG tablet    Sig: TAKE ONE TABLET BY MOUTH ONCE DAILY.    Dispense:  90 tablet    Refill:  1  . amphetamine-dextroamphetamine (ADDERALL) 20 MG tablet    Sig: Take 1 tablet (20 mg total) by mouth 2 (two) times daily.    Dispense:  60 tablet    Refill:  0  . amphetamine-dextroamphetamine (ADDERALL) 20 MG tablet    Sig: Take 1 tablet (20 mg total) by mouth 2 (two) times daily. For after 30d from signing    Dispense:  60 tablet    Refill:  0  . DISCONTD: amphetamine-dextroamphetamine (ADDERALL) 20 MG tablet    Sig: Take 1 tablet (20 mg total) by mouth 2 (two) times daily. For after 60 d from signing    Dispense:  60 tablet    Refill:  0  . ALPRAZolam (XANAX) 0.25 MG tablet    Sig: Take 1 tablet (0.25 mg total) by mouth 2 (two) times daily as needed for anxiety. At onset of panic    Dispense:  30 tablet    Refill:  1   By signing my name below, I, Raven Small, attest that this documentation has been prepared under the direction and in the presence of Ellamae Sia, MD.  Electronically Signed: Andrew Au, ED Scribe.  04/27/2015. 4:20 PM.  I have completed the patient encounter in its entirety as documented by the scribe, with editing by me where necessary. Kyrianna Barletta P. Merla Riches, M.D.

## 2015-04-28 DIAGNOSIS — H3530 Unspecified macular degeneration: Secondary | ICD-10-CM | POA: Insufficient documentation

## 2015-04-30 DIAGNOSIS — H353211 Exudative age-related macular degeneration, right eye, with active choroidal neovascularization: Secondary | ICD-10-CM | POA: Diagnosis not present

## 2015-04-30 DIAGNOSIS — H353210 Exudative age-related macular degeneration, right eye, stage unspecified: Secondary | ICD-10-CM | POA: Diagnosis not present

## 2015-04-30 DIAGNOSIS — Z880 Allergy status to penicillin: Secondary | ICD-10-CM | POA: Diagnosis not present

## 2015-04-30 DIAGNOSIS — Z885 Allergy status to narcotic agent status: Secondary | ICD-10-CM | POA: Diagnosis not present

## 2015-04-30 DIAGNOSIS — H2513 Age-related nuclear cataract, bilateral: Secondary | ICD-10-CM | POA: Diagnosis not present

## 2015-04-30 DIAGNOSIS — H3581 Retinal edema: Secondary | ICD-10-CM | POA: Diagnosis not present

## 2015-04-30 DIAGNOSIS — Z79899 Other long term (current) drug therapy: Secondary | ICD-10-CM | POA: Diagnosis not present

## 2015-05-25 ENCOUNTER — Encounter: Payer: Self-pay | Admitting: Internal Medicine

## 2015-05-26 DIAGNOSIS — H353211 Exudative age-related macular degeneration, right eye, with active choroidal neovascularization: Secondary | ICD-10-CM | POA: Diagnosis not present

## 2015-05-26 DIAGNOSIS — H3581 Retinal edema: Secondary | ICD-10-CM | POA: Diagnosis not present

## 2015-06-01 DIAGNOSIS — H25811 Combined forms of age-related cataract, right eye: Secondary | ICD-10-CM | POA: Diagnosis not present

## 2015-06-01 DIAGNOSIS — H2513 Age-related nuclear cataract, bilateral: Secondary | ICD-10-CM | POA: Diagnosis not present

## 2015-06-01 DIAGNOSIS — H2511 Age-related nuclear cataract, right eye: Secondary | ICD-10-CM | POA: Diagnosis not present

## 2015-06-02 DIAGNOSIS — H2512 Age-related nuclear cataract, left eye: Secondary | ICD-10-CM | POA: Diagnosis not present

## 2015-06-09 DIAGNOSIS — H353221 Exudative age-related macular degeneration, left eye, with active choroidal neovascularization: Secondary | ICD-10-CM | POA: Diagnosis not present

## 2015-06-23 DIAGNOSIS — H353211 Exudative age-related macular degeneration, right eye, with active choroidal neovascularization: Secondary | ICD-10-CM | POA: Diagnosis not present

## 2015-07-21 DIAGNOSIS — H353221 Exudative age-related macular degeneration, left eye, with active choroidal neovascularization: Secondary | ICD-10-CM | POA: Diagnosis not present

## 2015-07-25 DIAGNOSIS — H15102 Unspecified episcleritis, left eye: Secondary | ICD-10-CM | POA: Diagnosis not present

## 2015-07-28 DIAGNOSIS — H9012 Conductive hearing loss, unilateral, left ear, with unrestricted hearing on the contralateral side: Secondary | ICD-10-CM | POA: Diagnosis not present

## 2015-07-28 DIAGNOSIS — Z9889 Other specified postprocedural states: Secondary | ICD-10-CM | POA: Diagnosis not present

## 2015-07-28 DIAGNOSIS — H9072 Mixed conductive and sensorineural hearing loss, unilateral, left ear, with unrestricted hearing on the contralateral side: Secondary | ICD-10-CM | POA: Diagnosis not present

## 2015-08-04 DIAGNOSIS — H353211 Exudative age-related macular degeneration, right eye, with active choroidal neovascularization: Secondary | ICD-10-CM | POA: Diagnosis not present

## 2015-08-21 DIAGNOSIS — I10 Essential (primary) hypertension: Secondary | ICD-10-CM | POA: Diagnosis not present

## 2015-08-21 DIAGNOSIS — H3530 Unspecified macular degeneration: Secondary | ICD-10-CM | POA: Diagnosis not present

## 2015-08-21 DIAGNOSIS — Z79899 Other long term (current) drug therapy: Secondary | ICD-10-CM | POA: Diagnosis not present

## 2015-08-21 DIAGNOSIS — H9072 Mixed conductive and sensorineural hearing loss, unilateral, left ear, with unrestricted hearing on the contralateral side: Secondary | ICD-10-CM | POA: Diagnosis not present

## 2015-08-21 DIAGNOSIS — H906 Mixed conductive and sensorineural hearing loss, bilateral: Secondary | ICD-10-CM | POA: Diagnosis not present

## 2015-08-25 DIAGNOSIS — H353221 Exudative age-related macular degeneration, left eye, with active choroidal neovascularization: Secondary | ICD-10-CM | POA: Diagnosis not present

## 2015-09-03 DIAGNOSIS — Z4802 Encounter for removal of sutures: Secondary | ICD-10-CM | POA: Diagnosis not present

## 2015-09-03 DIAGNOSIS — Z48810 Encounter for surgical aftercare following surgery on the sense organs: Secondary | ICD-10-CM | POA: Diagnosis not present

## 2015-09-22 DIAGNOSIS — H353211 Exudative age-related macular degeneration, right eye, with active choroidal neovascularization: Secondary | ICD-10-CM | POA: Diagnosis not present

## 2015-09-30 ENCOUNTER — Encounter: Payer: Medicare Other | Admitting: Internal Medicine

## 2015-10-14 ENCOUNTER — Other Ambulatory Visit: Payer: Self-pay | Admitting: Internal Medicine

## 2015-10-14 NOTE — Telephone Encounter (Signed)
Meds ordered this encounter  Medications  . ALPRAZolam (XANAX) 0.25 MG tablet    Sig: TAKE ONE TABLET BY MOUTH TWICE DAILY AS NEEDED FOR ANXIETY AT ONSET OF PANIC    Dispense:  30 tablet    Refill:  5    phone in

## 2015-10-15 NOTE — Telephone Encounter (Signed)
Called in.

## 2015-10-20 DIAGNOSIS — H353221 Exudative age-related macular degeneration, left eye, with active choroidal neovascularization: Secondary | ICD-10-CM | POA: Diagnosis not present

## 2015-11-03 DIAGNOSIS — H353211 Exudative age-related macular degeneration, right eye, with active choroidal neovascularization: Secondary | ICD-10-CM | POA: Diagnosis not present

## 2015-11-13 ENCOUNTER — Other Ambulatory Visit: Payer: Self-pay | Admitting: Internal Medicine

## 2015-11-26 DIAGNOSIS — Z9621 Cochlear implant status: Secondary | ICD-10-CM | POA: Diagnosis not present

## 2015-11-26 DIAGNOSIS — Z9889 Other specified postprocedural states: Secondary | ICD-10-CM | POA: Diagnosis not present

## 2015-11-26 DIAGNOSIS — L089 Local infection of the skin and subcutaneous tissue, unspecified: Secondary | ICD-10-CM | POA: Diagnosis not present

## 2015-11-26 DIAGNOSIS — Z09 Encounter for follow-up examination after completed treatment for conditions other than malignant neoplasm: Secondary | ICD-10-CM | POA: Diagnosis not present

## 2015-12-01 DIAGNOSIS — H353221 Exudative age-related macular degeneration, left eye, with active choroidal neovascularization: Secondary | ICD-10-CM | POA: Diagnosis not present

## 2015-12-02 DIAGNOSIS — S0502XA Injury of conjunctiva and corneal abrasion without foreign body, left eye, initial encounter: Secondary | ICD-10-CM | POA: Diagnosis not present

## 2015-12-04 DIAGNOSIS — S0502XA Injury of conjunctiva and corneal abrasion without foreign body, left eye, initial encounter: Secondary | ICD-10-CM | POA: Diagnosis not present

## 2015-12-04 DIAGNOSIS — H353211 Exudative age-related macular degeneration, right eye, with active choroidal neovascularization: Secondary | ICD-10-CM | POA: Diagnosis not present

## 2015-12-04 DIAGNOSIS — H353221 Exudative age-related macular degeneration, left eye, with active choroidal neovascularization: Secondary | ICD-10-CM | POA: Diagnosis not present

## 2015-12-15 DIAGNOSIS — H353211 Exudative age-related macular degeneration, right eye, with active choroidal neovascularization: Secondary | ICD-10-CM | POA: Diagnosis not present

## 2015-12-25 ENCOUNTER — Ambulatory Visit (INDEPENDENT_AMBULATORY_CARE_PROVIDER_SITE_OTHER): Payer: Medicare Other | Admitting: Family Medicine

## 2015-12-25 ENCOUNTER — Ambulatory Visit (INDEPENDENT_AMBULATORY_CARE_PROVIDER_SITE_OTHER): Payer: Medicare Other

## 2015-12-25 VITALS — BP 134/88 | HR 84 | Temp 98.4°F | Resp 16 | Ht 62.25 in | Wt 262.8 lb

## 2015-12-25 DIAGNOSIS — I10 Essential (primary) hypertension: Secondary | ICD-10-CM

## 2015-12-25 DIAGNOSIS — Z6841 Body Mass Index (BMI) 40.0 and over, adult: Secondary | ICD-10-CM | POA: Diagnosis not present

## 2015-12-25 DIAGNOSIS — F909 Attention-deficit hyperactivity disorder, unspecified type: Secondary | ICD-10-CM | POA: Diagnosis not present

## 2015-12-25 DIAGNOSIS — M25561 Pain in right knee: Secondary | ICD-10-CM | POA: Diagnosis not present

## 2015-12-25 DIAGNOSIS — E038 Other specified hypothyroidism: Secondary | ICD-10-CM | POA: Diagnosis not present

## 2015-12-25 DIAGNOSIS — Z79899 Other long term (current) drug therapy: Secondary | ICD-10-CM | POA: Diagnosis not present

## 2015-12-25 DIAGNOSIS — M179 Osteoarthritis of knee, unspecified: Secondary | ICD-10-CM | POA: Diagnosis not present

## 2015-12-25 DIAGNOSIS — F900 Attention-deficit hyperactivity disorder, predominantly inattentive type: Secondary | ICD-10-CM

## 2015-12-25 DIAGNOSIS — M1731 Unilateral post-traumatic osteoarthritis, right knee: Secondary | ICD-10-CM | POA: Diagnosis not present

## 2015-12-25 MED ORDER — AMPHETAMINE-DEXTROAMPHETAMINE 20 MG PO TABS: 20.0000 mg | ORAL_TABLET | Freq: Two times a day (BID) | ORAL | Status: DC

## 2015-12-25 MED ORDER — ALPRAZOLAM 0.25 MG PO TABS: 0.2500 mg | ORAL_TABLET | Freq: Two times a day (BID) | ORAL | Status: DC | PRN

## 2015-12-25 MED ORDER — LOSARTAN POTASSIUM 50 MG PO TABS: ORAL_TABLET | ORAL | Status: DC

## 2015-12-25 MED ORDER — HYDROCHLOROTHIAZIDE 25 MG PO TABS: ORAL_TABLET | ORAL | Status: DC

## 2015-12-25 MED ORDER — MELOXICAM 15 MG PO TABS: 15.0000 mg | ORAL_TABLET | Freq: Every day | ORAL | Status: DC

## 2015-12-25 NOTE — Progress Notes (Addendum)
Subjective:    Patient ID: Christina NatalMary W Overholser, female    DOB: 01-16-1946, 70 y.o.   MRN: [id number] By signing my name below, I, Javier Dockerobert Ryan Halas, attest that this documentation has been prepared under the direction and in the presence of [name], MD. Electronically Signed: Javier Dockerobert Ryan Halas, ER Scribe. 12/25/2015. 3:38 PM.  Chief Complaint  Patient presents with  . Medication Refill    Levothyroxine, stop the Adderall in Feb. and would like to restart Adderall   . Knee Pain    right knee pain    HPI HPI Comments: Christina Hays is a 70 y.o. female with a past hx of wet macular degeneration, HTN, HLD and panic attacks who [facility] medication refill. She would like to go back on Adderol. She stopped taking adderol on February, 2017 to see what she would feel like. She has had more trouble at her job and more trouble driving without it. She has been on her current dose for 15 years. She only takes her adderoll when she is working or driving. She has been on the same dose of levothyroxine for 4 years. Her blood pressure is usually in the 140s/80s. She was diagnosed by Dr. De Blanchalph Doddery who was a psychiatrist in [place].  She takes alprazolam 1-3 times per week. She suffers from panic attacks some of the time when she tries to leaves her house. She usually takes a xanex before she goes to her eye doctor for an injection. Her mother also suffered from wet macular degeneration and went blind over the course of two weeks.She is followed closely by her eye doctor Dr. Luciana Axeankin.   She is also complaining of distal anterior knee pain, as well as tightness in her lateral popliteal fossa. The knee pain started on April 5th after she twisted her knee getting out of her car. She has used bengay, acetaminophen, and aspercream with temporary relief. She has not used a brace. She is planning on starting water aerobics, and will call in one month if her knee pain has not removed.     Past Medical History  Diagnosis Date  . Hypertension   . Hypothyroidism   . ADD (attention deficit disorder with hyperactivity)   . Hearing loss   . Obesity   . Depression   . Migraine   . Cholesteatoma of ear   . Allergy   . Barrett's esophagus   . GERD (gastroesophageal reflux disease)    Allergies  Allergen Reactions  . Citalopram Hydrobromide     psychotic  . Codeine     hallucinations  . Penicillins Hives   Current Outpatient Prescriptions on File Prior to Visit  Medication Sig Dispense Refill  . ALPRAZolam (XANAX) 0.25 MG tablet TAKE ONE TABLET BY MOUTH TWICE DAILY AS NEEDED FOR ANXIETY AT ONSET OF PANIC 30 tablet 5  . Cholecalciferol (VITAMIN D3) 2000 UNITS capsule Take 6,000 Units by mouth daily.    . hydrochlorothiazide (HYDRODIURIL) 25 MG tablet TAKE ONE TABLET BY MOUTH ONCE DAILY. 90 tablet 1  . levothyroxine (SYNTHROID, LEVOTHROID) 100 MCG tablet TAKE ONE TABLET BY MOUTH ONCE DAILY BEFORE BREAKFAST 30 tablet 0  . losartan (COZAAR) 50 MG tablet TAKE ONE TABLET BY MOUTH ONCE DAILY. 90 tablet 1  . multivitamin-lutein (OCUVITE-LUTEIN) CAPS capsule Take 1 capsule by mouth daily.     No current facility-administered medications on file prior to visit.    Review of Systems  Constitutional: Positive for activity change.  Negative for fever, chills and appetite change.  Respiratory: Negative for cough, chest tightness and shortness of breath.   Cardiovascular: Positive for leg swelling. Negative for chest pain and palpitations.  Gastrointestinal: Negative for abdominal pain.  Musculoskeletal: Positive for myalgias, back pain, joint swelling, arthralgias and gait problem.  Psychiatric/Behavioral: Positive for decreased concentration. The patient is nervous/anxious.       Objective:  BP 134/88 mmHg  Pulse 84  Temp(Src) 98.4 F (36.9 C) (Oral)  Resp 16  Ht 5' 2.25" (1.581 m)  Wt 262 lb 12.8 oz (119.205 kg)  BMI 47.69 kg/m2  SpO2 96%  Physical Exam   Constitutional: She is oriented to person, place, and time. She appears well-developed and well-nourished. No distress.  HENT:  Head: Normocephalic and atraumatic.  Eyes: Pupils are equal, round, and reactive to light.  Neck: Neck supple.  Cardiovascular: Normal rate.   Pulmonary/Chest: Effort normal. No respiratory distress.  Musculoskeletal: Normal range of motion.  On right leg extension limited by 15 degrees, and a mild reduction in flexion. Some fullness of the popliteal fossa on right. She has an effusion and pain over the medial collateral ligament area. On both the medial and lateral joint line she has tenderness on right.   Neurological: She is alert and oriented to person, place, and time. Coordination normal.  Skin: Skin is warm and dry. She is not diaphoretic.  Psychiatric: She has a normal mood and affect. Her behavior is normal.  Nursing note and vitals reviewed.    Dg Knee 1-2 Views Right  12/25/2015  CLINICAL DATA:  Right knee pain and swelling for 3 months. EXAM: RIGHT KNEE - 1-2 VIEW COMPARISON:  None. FINDINGS: Knee osteoarthritis with joint narrowing and spurring greatest at the patellofemoral compartment. AP imaging on the left shows medial compartment narrowing. No fracture deformity, subluxation, joint effusion, or evidence of focal bone lesion. IMPRESSION: Tricompartmental osteoarthritis most advanced at the patellofemoral compartment. Electronically Signed   By: Marnee SpringJonathon  Watts M.D.   On: 12/25/2015 15:56    Assessment & Plan:   1. Essential hypertension   2. Other specified hypothyroidism - levothyroxine 100 mcg refilled as tsh at goal - has been on same dose for >4 yrs  3. Attention deficit hyperactivity disorder (ADHD), unspecified ADHD type   4. Medication management   5. Right knee pain   6. Attention deficit hyperactivity disorder (ADHD), predominantly inattentive type   7. Post-traumatic osteoarthritis of right knee - pt's obesity is def contributing as  well as I don't think she would be able to comfortably wear any of the braces we having in the office nor do i feel confident enough to find landmarks for a successful joint injection so will do trial of conservative mngmnt with RICE and step up nsaids. If sxs do not improve, refer to ortho.  8. BMI 40.0-44.9, adult (HCC)     Orders Placed This Encounter  Procedures  . DG Knee 1-2 Views Right    Standing Status: Future     Number of Occurrences: 1     Standing Expiration Date: 12/24/2016    Order Specific Question:  Reason for Exam (SYMPTOM  OR DIAGNOSIS REQUIRED)    Answer:  pain and swelling x 3 mos    Order Specific Question:  Preferred imaging location?    Answer:  External  . TSH  . Comprehensive metabolic panel    Meds ordered this encounter  Medications  . losartan (COZAAR) 50 MG tablet    Sig:  TAKE ONE TABLET BY MOUTH ONCE DAILY.    Dispense:  90 tablet    Refill:  1  . hydrochlorothiazide (HYDRODIURIL) 25 MG tablet    Sig: TAKE ONE TABLET BY MOUTH ONCE DAILY.    Dispense:  90 tablet    Refill:  1  . amphetamine-dextroamphetamine (ADDERALL) 20 MG tablet    Sig: Take 1 tablet (20 mg total) by mouth 2 (two) times daily. For after 30d from signing    Dispense:  60 tablet    Refill:  0  . amphetamine-dextroamphetamine (ADDERALL) 20 MG tablet    Sig: Take 1 tablet (20 mg total) by mouth 2 (two) times daily.    Dispense:  60 tablet    Refill:  0  . ALPRAZolam (XANAX) 0.25 MG tablet    Sig: Take 1 tablet (0.25 mg total) by mouth 2 (two) times daily as needed for anxiety.    Dispense:  30 tablet    Refill:  4  . meloxicam (MOBIC) 15 MG tablet    Sig: Take 1 tablet (15 mg total) by mouth daily.    Dispense:  30 tablet    Refill:  1    I personally performed the services described in this documentation, which was scribed in my presence. The recorded information has been reviewed and considered, and addended by me as needed.   Norberto Sorenson, M.D.  [facility] [address] [place], [place] [address] [phone] phone [phone] fax  12/28/2015 7:36 AM  Results for orders placed or performed in visit on 12/25/15  TSH  Result Value Ref Range   TSH 1.86 mIU/L  Comprehensive metabolic panel  Result Value Ref Range   Sodium 141 135 - 146 mmol/L   Potassium 3.7 3.5 - 5.3 mmol/L   Chloride 100 98 - 110 mmol/L   CO2 28 20 - 31 mmol/L   Glucose, Bld 100 (H) 65 - 99 mg/dL   BUN 13 7 - 25 mg/dL   Creat 1.30 8.65 - 7.84 mg/dL   Total Bilirubin 0.7 0.2 - 1.2 mg/dL   Alkaline Phosphatase 85 33 - 130 U/L   AST 28 10 - 35 U/L   ALT 25 6 - 29 U/L   Total Protein 7.9 6.1 - 8.1 g/dL   Albumin 4.5 3.6 - 5.1 g/dL   Calcium 9.3 8.6 - 69.6 mg/dL

## 2015-12-25 NOTE — Patient Instructions (Addendum)
IF you received an x-ray today, you will receive an invoice from [facility]. Please contact [facility] at [phone] with questions or concerns regarding your invoice.   IF you received labwork today, you will receive an invoice from [facility]. Please contact [facility] at [phone] with questions or concerns regarding your invoice.   Our billing staff will not be able to assist you with questions regarding bills from these companies.  You will be contacted with the lab results as soon as they are available. The fastest way to get your results is to activate your My Chart account. Instructions are located on the last page of this paperwork. If you have not heard from us regarding the results in 2 weeks, please contact this office.     We recommend that you schedule a mammogram for breast cancer screening. Typically, you do not need a referral to do this. Please contact a local imaging center to schedule your mammogram.  [facility] - [phone]  *ask for the Radiology Department [facility]) - [phone] or [phone]  [facility] - [phone] [facility] - [phone] [facility] - [phone]  *ask for the Radiology Department [facility] - [phone]) [phone]  *ask for the Radiology Department [facility] - [phone]  *ask for the Mammography Department [facility] - [phone]   Osteoarthritis Osteoarthritis is a disease that causes soreness and inflammation of a joint. It occurs when the cartilage at the affected joint wears down. Cartilage acts as a cushion, covering the ends of bones where they meet to form a joint. Osteoarthritis is the most common form of arthritis. It often occurs in older people. The joints affected most often by this condition include those in the:  Ends of the  fingers.  Thumbs.  Neck.  Lower back.  Knees.  Hips. CAUSES  Over time, the cartilage that covers the ends of bones begins to wear away. This causes bone to rub on bone, producing pain and stiffness in the affected joints.  RISK FACTORS Certain factors can increase your chances of having osteoarthritis, including:  Older age.  Excessive body weight.  Overuse of joints.  Previous joint injury. SIGNS AND SYMPTOMS   Pain, swelling, and stiffness in the joint.  Over time, the joint may lose its normal shape.  Small deposits of bone (osteophytes) may grow on the edges of the joint.  Bits of bone or cartilage can break off and float inside the joint space. This may cause more pain and damage. DIAGNOSIS  Your health care provider will do a physical exam and ask about your symptoms. Various tests may be ordered, such as:  X-rays of the affected joint.  Blood tests to rule out other types of arthritis. Additional tests may be used to diagnose your condition. TREATMENT  Goals of treatment are to control pain and improve joint function. Treatment plans may include:  A prescribed exercise program that allows for rest and joint relief.  A weight control plan.  Pain relief techniques, such as:  Properly applied heat and cold.  Electric pulses delivered to nerve endings under the skin (transcutaneous electrical nerve stimulation [TENS]).  Massage.  Certain nutritional supplements.  Medicines to control pain, such as:  Acetaminophen.  Nonsteroidal anti-inflammatory drugs (NSAIDs), such as naproxen.  Narcotic or central-acting agents, such as tramadol.  Corticosteroids. These can be given orally or as an injection.  Surgery to reposition the bones and relieve pain (osteotomy) or to remove loose pieces of bone and cartilage. Joint replacement may be needed in advanced states of osteoarthritis. HOME CARE INSTRUCTIONS   Take medicines only as directed by your health  care provider.  Maintain a healthy weight. Follow your health care provider's instructions for weight control. This may include dietary instructions.  Exercise as directed. Your health care provider can recommend specific types of exercise. These may include:  Strengthening exercises. These are done to strengthen the muscles that support joints affected by arthritis. They can be performed with weights or with exercise bands to add resistance.  Aerobic activities. These are exercises, such as brisk walking or low-impact aerobics, that get your heart pumping.  Range-of-motion activities. These keep your joints limber.  Balance and agility exercises. These help you maintain daily living skills.  Rest your affected joints as directed by your health care provider.  Keep all follow-up visits as directed by your health care provider. SEEK MEDICAL CARE IF:   Your skin turns red.  You develop a rash in addition to your joint pain.  You have worsening joint pain.  You have a fever along with joint or muscle aches. SEEK IMMEDIATE MEDICAL CARE IF:  You have a significant loss of weight or appetite.  You have night sweats. FOR MORE INFORMATION   National Institute of Arthritis and Musculoskeletal and Skin Diseases: www.niams.http://www.myers.net/nih.gov  [employer] Aging: https://walker.com/www.nia.nih.gov  American College of Rheumatology: www.rheumatology.org   This information is not intended to replace advice given to you by your health care provider. Make sure you discuss any questions you have with your health care provider.   Document Released: 06/13/2005 Document Revised: 07/04/2014 Document Reviewed: 02/18/2013 Elsevier Interactive Patient Education [employer]2016 Elsevier Inc.

## 2015-12-26 LAB — COMPREHENSIVE METABOLIC PANEL
ALBUMIN: 4.5 g/dL (ref 3.6–5.1)
ALT: 25 U/L (ref 6–29)
AST: 28 U/L (ref 10–35)
Alkaline Phosphatase: 85 U/L (ref 33–130)
BUN: 13 mg/dL (ref 7–25)
CHLORIDE: 100 mmol/L (ref 98–110)
CO2: 28 mmol/L (ref 20–31)
Calcium: 9.3 mg/dL (ref 8.6–10.4)
Creat: 0.9 mg/dL (ref 0.50–0.99)
Glucose, Bld: 100 mg/dL — ABNORMAL HIGH (ref 65–99)
Potassium: 3.7 mmol/L (ref 3.5–5.3)
Sodium: 141 mmol/L (ref 135–146)
Total Bilirubin: 0.7 mg/dL (ref 0.2–1.2)
Total Protein: 7.9 g/dL (ref 6.1–8.1)

## 2015-12-26 LAB — TSH: TSH: 1.86 m[IU]/L

## 2015-12-28 MED ORDER — LEVOTHYROXINE SODIUM 100 MCG PO TABS: 100.0000 ug | ORAL_TABLET | Freq: Every day | ORAL | Status: DC

## 2016-01-12 DIAGNOSIS — H353211 Exudative age-related macular degeneration, right eye, with active choroidal neovascularization: Secondary | ICD-10-CM | POA: Diagnosis not present

## 2016-01-26 DIAGNOSIS — H353221 Exudative age-related macular degeneration, left eye, with active choroidal neovascularization: Secondary | ICD-10-CM | POA: Diagnosis not present

## 2016-02-03 ENCOUNTER — Ambulatory Visit (INDEPENDENT_AMBULATORY_CARE_PROVIDER_SITE_OTHER): Payer: Medicare Other | Admitting: Physician Assistant

## 2016-02-03 VITALS — BP 130/82 | HR 108 | Temp 98.5°F | Resp 18 | Ht 62.25 in | Wt 259.0 lb

## 2016-02-03 DIAGNOSIS — N611 Abscess of the breast and nipple: Secondary | ICD-10-CM | POA: Diagnosis not present

## 2016-02-03 MED ORDER — DOXYCYCLINE HYCLATE 100 MG PO CAPS: 100.0000 mg | ORAL_CAPSULE | Freq: Two times a day (BID) | ORAL | 0 refills | Status: AC

## 2016-02-03 NOTE — Patient Instructions (Addendum)
Take antibiotic twice a day for 10 days. Apply heat. If redness spreads more than 1 cm beyond border, return. Return if symptoms worsen   IF you received an x-ray today, you will receive an invoice from [facility]. Please contact [facility] at [phone] with questions or concerns regarding your invoice.   IF you received labwork today, you will receive an invoice from [facility]. Please contact [facility] at [phone] with questions or concerns regarding your invoice.   Our billing staff will not be able to assist you with questions regarding bills from these companies.  You will be contacted with the lab results as soon as they are available. The fastest way to get your results is to activate your My Chart account. Instructions are located on the last page of this paperwork. If you have not heard from us regarding the results in 2 weeks, please contact this office.     We recommend that you schedule a mammogram for breast cancer screening. Typically, you do not need a referral to do this. Please contact a local imaging center to schedule your mammogram.  [facility] - [phone]  *ask for the Radiology Department [facility] - [phone] or [phone]  [facility] - [phone] [facility] - [phone] [facility] - [phone]  *ask for the Radiology Department [facility] - [phone]  *ask for the Radiology Department [facility] - [phone]  *ask for the Mammography Department [facility] - [phone]

## 2016-02-03 NOTE — Progress Notes (Signed)
[facility] [address], [place] [address] [phone]  Date:  02/03/2016   Name:  Christina Hays   DOB:  [birth year]   MRN:  [id number]  PCP:  Tonye Pearson, MD    Chief Complaint: Cyst (boil under left breast)   History of Present Illness:  This is a 70 y.o. female with PMH OA, barrett's esophagus, ADHD, HTN, hypothyroid who is presenting with a boil under her left breast. Noticed it yesterday morning. As soon as she realized it, she also noticed it was already draining. She squeezed the area and thought she got everything out of it. She cleaned the area and placed antibacterial ointment on it. This morning she realized the redness surrounding the area was larger. She is only having very occ "twinges" of pain. Yesterday she felt like she was running a fever but did not check her temperature. Today she is feeling fine.  Review of Systems:  Review of Systems See HPI  Patient Active Problem List   Diagnosis Date Noted  . Macular degeneration 04/28/2015  . Special screening for malignant neoplasms, colon 03/29/2013  . Barrett's esophagus 09/10/2012  . Thyroid nodule 09/10/2012  . Other and unspecified hyperlipidemia 09/10/2012  . Osteoarthritis 09/10/2012  . BMI 40.0-44.9, adult (HCC) 09/05/2012  . Hypothyroid 07/27/2011  . HTN (hypertension) 07/27/2011  . ADHD (attention deficit hyperactivity disorder) 07/27/2011  . Cyclic vomiting syndrome 07/27/2011    Prior to Admission medications   Medication Sig Start Date End Date Taking? Authorizing Provider  ALPRAZolam (XANAX) 0.25 MG tablet Take 1 tablet (0.25 mg total) by mouth 2 (two) times daily as needed for anxiety. 12/25/15  Yes Sherren Mocha, MD  amphetamine-dextroamphetamine (ADDERALL) 20 MG tablet Take 1 tablet (20 mg total) by mouth 2 (two) times daily. For after 30d from signing 12/25/15  Yes Sherren Mocha, MD  amphetamine-dextroamphetamine (ADDERALL) 20 MG tablet Take 1 tablet (20 mg total) by  mouth 2 (two) times daily. 12/25/15  Yes Sherren Mocha, MD  Cholecalciferol (VITAMIN D3) 2000 UNITS capsule Take 6,000 Units by mouth daily.   Yes Historical Provider, MD  hydrochlorothiazide (HYDRODIURIL) 25 MG tablet TAKE ONE TABLET BY MOUTH ONCE DAILY. 12/25/15  Yes Sherren Mocha, MD  levothyroxine (SYNTHROID, LEVOTHROID) 100 MCG tablet Take 1 tablet (100 mcg total) by mouth daily before breakfast. 12/28/15  Yes Sherren Mocha, MD  losartan (COZAAR) 50 MG tablet TAKE ONE TABLET BY MOUTH ONCE DAILY. 12/25/15  Yes Sherren Mocha, MD  meloxicam (MOBIC) 15 MG tablet Take 1 tablet (15 mg total) by mouth daily. 12/25/15  Yes Sherren Mocha, MD  multivitamin-lutein [facility]) CAPS capsule Take 1 capsule by mouth daily.   Yes Historical Provider, MD    Allergies  Allergen Reactions  . Citalopram Hydrobromide     psychotic  . Codeine     hallucinations  . Penicillins Hives    Past Surgical History:  Procedure Laterality Date  . MIDDLE EAR SURGERY  01/2011   replaced missing malleus  . TYMPANIC MEMBRANE REPAIR  1990   tighting  . WRIST SURGERY      Social History  Substance Use Topics  . Smoking status: Never Smoker  . Smokeless tobacco: Never Used  . Alcohol use Yes     Comment: one glass of wine a month.    Family History  Problem Relation Age of Onset  . Liver cancer Maternal Grandfather   . Colon cancer Maternal Grandmother   .  Hypertension Maternal Grandmother   . Thyroid disease Maternal Grandmother   . Colon polyps Mother   . Emphysema Mother   . Thyroid disease Mother   . Emphysema Father   . Stroke Father   . Asthma Father   . Esophageal cancer Neg Hx   . Stomach cancer Neg Hx   . Rectal cancer Neg Hx     Medication list has been reviewed and updated.  Physical Examination:  Physical Exam  Constitutional: She is oriented to person, place, and time. She appears well-developed and well-nourished. No distress.  HENT:  Head: Normocephalic and atraumatic.  Right Ear: Hearing  normal.  Left Ear: Hearing normal.  Nose: Nose normal.  Eyes: Conjunctivae and lids are normal. Right eye exhibits no discharge. Left eye exhibits no discharge. No scleral icterus.  Pulmonary/Chest: Effort normal. No respiratory distress.  Musculoskeletal: Normal range of motion.  Neurological: She is alert and oriented to person, place, and time.  Skin: Skin is warm, dry and intact. No lesion and no rash noted.  Left inferior breast with erythema and warmth with a central draining pore. Purulence expressed. No induration. Mild tenderness. Redness demarcated.  Psychiatric: She has a normal mood and affect. Her speech is normal and behavior is normal. Thought content normal.    BP 130/82 (BP Location: Right Arm, Patient Position: Sitting, Cuff Size: Large)   Pulse (!) 108   Temp 98.5 F (36.9 C) (Oral)   Resp 18   Ht 5' 2.25" (1.581 m)   Wt 259 lb (117.5 kg)   SpO2 99%   BMI 46.99 kg/m   Assessment and Plan:  1. Abscess of left breast Treat with doxy and warm compresses. Wound culture pending. Return if symptoms not improving or if erythema spreads significantly beyond demarcation. - doxycycline (VIBRAMYCIN) 100 MG capsule; Take 1 capsule (100 mg total) by mouth 2 (two) times daily. AVOID EXCESS SUN EXPOSURE WHILE ON THIS MEDICATION  Dispense: 20 capsule; Refill: 0 - WOUND CULTURE   Roswell MinersNicole V. Dyke BrackettBush, PA-C, MHS [facility]  02/03/2016

## 2016-02-04 ENCOUNTER — Encounter: Payer: Self-pay | Admitting: Physician Assistant

## 2016-02-06 LAB — WOUND CULTURE
Gram Stain: NONE SEEN
Gram Stain: NONE SEEN
ORGANISM ID, BACTERIA: NO GROWTH

## 2016-02-11 ENCOUNTER — Encounter: Payer: Self-pay | Admitting: Physician Assistant

## 2016-02-12 NOTE — Telephone Encounter (Signed)
Please call this patient and advise on her wound culture. Deliah BostonMichael Suan Pyeatt, MS, PA-C 1:21 PM, 02/12/2016

## 2016-02-16 ENCOUNTER — Encounter: Payer: Self-pay | Admitting: Physician Assistant

## 2016-02-16 DIAGNOSIS — H353211 Exudative age-related macular degeneration, right eye, with active choroidal neovascularization: Secondary | ICD-10-CM | POA: Diagnosis not present

## 2016-03-02 NOTE — Progress Notes (Signed)
Subjective:    Christina Hays is a 70 y.o. female who presents for Medicare Annual/Subsequent preventive examination.  She was a pt of my former colleague Dr. Merla Riches.  Her last CPE was 2.5 years ago.  Preventive Screening-Counseling & Management  Tobacco History  Smoking Status  . Never Smoker  Smokeless Tobacco  . Never Used     Problems Prior to Visit 1. HTN:  On losartan 50, hctz 25 - Pt is going to transfer pharmacies as she was charged 90$ for generic losartan so only hctz - has been off the losartan for about 2-3 wks. Does not have a blood pressure cuff at home 2. Hypothyroid: On levothyroxine 100 mcg - dose stable for years 3. ADD: Has been on adderall 20mg  bid for years, stable and seen every 6 months for refills. 4. Obesity BMI >40, has had elevated blood glucose prior, hgba1c 5.7 when check 18 mos piror 5. HPL: Is fasting 6. Cyclic vomiting syndrome: prn zofran - no episode since 10/16 - the longest she had ever gone prior to that was 6 mos so doing great! 7. OA: right knee pain woresning but getting a lot of good improvement from meloxicam.  Would like a referral to see Kristeen Miss for further eval.  She would like to have a cortisone injection if she is a candidate. 8. Barrett's esophagus - stays on ppi - protonix 40, follows with Dr. Arlyce Dice 9.  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 Sherryll Burger at [facility] every mo for injection - her bp is always good there.  Current Problems (verified) Patient Active Problem List   Diagnosis Date Noted  . Macular degeneration 04/28/2015  . Special screening for malignant neoplasms, colon 03/29/2013  . Barrett's esophagus 09/10/2012  . Thyroid nodule 09/10/2012  . Other and unspecified hyperlipidemia 09/10/2012  . Osteoarthritis 09/10/2012  . BMI 40.0-44.9, adult (HCC) 09/05/2012  . Hypothyroid 07/27/2011  . HTN (hypertension) 07/27/2011  . ADHD (attention deficit hyperactivity disorder) 07/27/2011  . Cyclic vomiting  syndrome 07/27/2011    Medications Prior to Visit Current Outpatient Prescriptions on File Prior to Visit  Medication Sig Dispense Refill  . ALPRAZolam (XANAX) 0.25 MG tablet Take 1 tablet (0.25 mg total) by mouth 2 (two) times daily as needed for anxiety. 30 tablet 4  . amphetamine-dextroamphetamine (ADDERALL) 20 MG tablet Take 1 tablet (20 mg total) by mouth 2 (two) times daily. For after 30d from signing 60 tablet 0  . amphetamine-dextroamphetamine (ADDERALL) 20 MG tablet Take 1 tablet (20 mg total) by mouth 2 (two) times daily. 60 tablet 0  . Cholecalciferol (VITAMIN D3) 2000 UNITS capsule Take 6,000 Units by mouth daily.    . hydrochlorothiazide (HYDRODIURIL) 25 MG tablet TAKE ONE TABLET BY MOUTH ONCE DAILY. 90 tablet 1  . levothyroxine (SYNTHROID, LEVOTHROID) 100 MCG tablet Take 1 tablet (100 mcg total) by mouth daily before breakfast. 90 tablet 3  . losartan (COZAAR) 50 MG tablet TAKE ONE TABLET BY MOUTH ONCE DAILY. 90 tablet 1  . meloxicam (MOBIC) 15 MG tablet Take 1 tablet (15 mg total) by mouth daily. 30 tablet 1  . multivitamin-lutein (OCUVITE-LUTEIN) CAPS capsule Take 1 capsule by mouth daily.     No current facility-administered medications on file prior to visit.     Current Medications (verified) Current Outpatient Prescriptions  Medication Sig Dispense Refill  . ALPRAZolam (XANAX) 0.25 MG tablet Take 1 tablet (0.25 mg total) by mouth 2 (two) times daily as needed for anxiety. 30 tablet 4  .  amphetamine-dextroamphetamine (ADDERALL) 20 MG tablet Take 1 tablet (20 mg total) by mouth 2 (two) times daily. For after 30d from signing 60 tablet 0  . amphetamine-dextroamphetamine (ADDERALL) 20 MG tablet Take 1 tablet (20 mg total) by mouth 2 (two) times daily. 60 tablet 0  . Cholecalciferol (VITAMIN D3) 2000 UNITS capsule Take 6,000 Units by mouth daily.    . hydrochlorothiazide (HYDRODIURIL) 25 MG tablet TAKE ONE TABLET BY MOUTH ONCE DAILY. 90 tablet 1  . levothyroxine (SYNTHROID,  LEVOTHROID) 100 MCG tablet Take 1 tablet (100 mcg total) by mouth daily before breakfast. 90 tablet 3  . losartan (COZAAR) 50 MG tablet TAKE ONE TABLET BY MOUTH ONCE DAILY. 90 tablet 1  . meloxicam (MOBIC) 15 MG tablet Take 1 tablet (15 mg total) by mouth daily. 30 tablet 1  . multivitamin-lutein (OCUVITE-LUTEIN) CAPS capsule Take 1 capsule by mouth daily.     No current facility-administered medications for this visit.      Allergies (verified) Citalopram hydrobromide; Codeine; and Penicillins   PAST HISTORY  Family History Family History  Problem Relation Age of Onset  . Liver cancer Maternal Grandfather   . Colon cancer Maternal Grandmother   . Hypertension Maternal Grandmother   . Thyroid disease Maternal Grandmother   . Colon polyps Mother   . Emphysema Mother   . Thyroid disease Mother   . Emphysema Father   . Stroke Father   . Asthma Father   . Esophageal cancer Neg Hx   . Stomach cancer Neg Hx   . Rectal cancer Neg Hx   GM with colon cancer  Social History Social History  Substance Use Topics  . Smoking status: Never Smoker  . Smokeless tobacco: Never Used  . Alcohol use Yes     Comment: one glass of wine a month.     Are there smokers in your home (other than you)? No - was exposed when growing up as both her parents passed from COPD/emphesyma but pt is now living alone.    Risk Factors Current exercise habits: Home exercise routine includes walking 1-2 mi/d hrs per day.  Dietary issues discussed: has been working hard on weight loss and has been loosing the 30lbs she gain. Lives alone so very difficult to follow the heart healthy diet she knows she should be complying with. Tries to limit sandwhiches to 1x/d or less. Will eat more salads or pre-prepared frozen food. Canned fruit.  Cardiac risk factors: advanced age (older than 32 for men, 77 for women), hypertension, obesity (BMI >= 30 kg/m2), sedentary lifestyle and smoking/ tobacco exposure.    Depression  Screen Depression screen [facility] 2/9 03/03/2016 02/03/2016 04/27/2015 08/27/2014 09/18/2013  Decreased Interest 0 0 3 0 2  Down, Depressed, Hopeless 0 0 3 1 2   PHQ - 2 Score 0 0 6 1 4   Altered sleeping - - 1 - 0  Tired, decreased energy - - 1 - 3  Change in appetite - - 0 - 0  Feeling bad or failure about yourself  - - 0 - 2  Trouble concentrating - - 3 - 3  Moving slowly or fidgety/restless - - 0 - 0  Suicidal thoughts - - 0 - 0  PHQ-9 Score - - 11 - 12  Difficult doing work/chores - - Very difficult - -    (Note: if answer to either of the following is "Yes", a more complete depression screening is indicated)   Over the past two weeks, have you felt down, depressed  or hopeless? No  Over the past two weeks, have you felt little interest or pleasure in doing things? No  Have you lost interest or pleasure in daily life? No  Do you often feel hopeless? No  Do you cry easily over simple problems? No  Activities of Daily Living In your present state of health, do you have any difficulty performing the following activities?:  Driving? No Managing money?  No Feeding yourself? No Getting from bed to chair? No Climbing a flight of stairs? Yes - avoids due to knee and panic attacks Preparing food and eating?: No Bathing or showering? No Getting dressed: No Getting to the toilet? No Using the toilet:No Moving around from place to place: No In the past year have you fallen or had a near fall?:No   Are you sexually active?  No  Do you have more than one partner?  No  Hearing Difficulties: No Do you often ask people to speak up or repeat themselves? Yes - Beatris Ship at [facility] and audiology - has baha on left and a nml hearing aide on a right Do you experience ringing or noises in your ears? Yes - more with elevated blood pressure Do you have difficulty understanding soft or whispered voices? Yes   Do you feel that you have a problem with memory? Yes - mother had dementia when she  passed at 64 yo.  She does feel like she has a mild problem with word-finding  Do you often misplace items? No  - no change from baseline  Do you feel safe at home?  Yes  Cognitive Testing  Alert? Yes  Normal Appearance?Yes  Oriented to person? Yes  Place? Yes   Time? Yes  Recall of three objects?  Yes  Can perform simple calculations? Yes  Displays appropriate judgment?Yes  Can read the correct time from a watch face?Yes   Advanced Directives have been discussed with the patient? Yes - does not have a living will or a HC POA - does not want any extrodinary measures such as tube feeding, would be ok with CPR in her current state of health but if she did have not any quality of life and was dependent on a vent, she would want to be taken off.  List the Names of Other Physician/Practitioners you currently use: 1.  GI: Dr. Arlyce Dice 2.  Optho - [facility]ll [facility] at [facility] 3.  Dr. Christy Gentles any recent Medical Services you may have received from other than [facility] providers in the past year (date may be approximate).  Immunization History  Administered Date(s) Administered  . Influenza,inj,Quad PF,36+ Mos 08/27/2014  . Influenza-Unspecified 04/10/2013  . Pneumococcal Conjugate-13 08/27/2014  . Pneumococcal Polysaccharide-23 08/05/2007, 09/18/2013  . Tdap 01/25/2010  . Zoster 05/10/2008    Screening Tests Health Maintenance  Topic Date Due  . Hepatitis C Screening  1945/08/03  . COLONOSCOPY  01/01/2012  . MAMMOGRAM  10/03/2015  . INFLUENZA VACCINE  01/26/2016  . TETANUS/TDAP  01/26/2020  . DEXA SCAN  Completed  . ZOSTAVAX  Completed  . PNA vac Low Risk Adult  Completed    All answers were reviewed with the patient and necessary referrals were made:  Teara Duerksen, MD   03/02/2016   History reviewed: allergies, current medications, past family history, past medical history, past social history, past surgical history and problem list  Review of Systems Pertinent items noted in HPI and  remainder of comprehensive ROS otherwise negative.    Objective:  BP 122/80   Pulse 100   Temp 98.3 F (36.8 C) (Oral)   Resp 18   Ht 5' 2.25" (1.581 m)   Wt 252 lb (114.3 kg)   SpO2 96%   BMI 45.72 kg/m   Visual Acuity Screening   Right eye Left eye Both eyes  Without correction: 20/100 20/70 20/70  With correction:       BP 122/80   Pulse 100   Temp 98.3 F (36.8 C) (Oral)   Resp 18   Ht 5' 2.25" (1.581 m)   Wt 252 lb (114.3 kg)   SpO2 96%   BMI 45.72 kg/m   General Appearance:    Alert, cooperative, no distress, appears stated age  Head:    Normocephalic, without obvious abnormality, atraumatic  Eyes:    PERRL, conjunctiva/corneas clear, EOM's intact, fundi    benign, both eyes  Ears:    Normal TM's and external ear canals, both ears  Nose:   Nares normal, septum midline, mucosa normal, no drainage    or sinus tenderness  Throat:   Lips, mucosa, and tongue normal; teeth and gums normal  Neck:   Supple, symmetrical, trachea midline, no adenopathy;    thyroid:  no enlargement/tenderness/nodules; no carotid   bruit or JVD  Back:     Symmetric, no curvature, ROM normal, no CVA tenderness  Lungs:     Clear to auscultation bilaterally, respirations unlabored  Chest Wall:    No tenderness or deformity   Heart:    Regular rate and rhythm, S1 and S2 normal, no murmur, rub   or gallop  Breast Exam:    No tenderness, masses, or nipple abnormality  Abdomen:     Soft, non-tender, bowel sounds active all four quadrants,    no masses, no organomegaly        Extremities:   Extremities normal, atraumatic, no cyanosis or edema  Pulses:   2+ and symmetric all extremities  Skin:   Skin color, texture, turgor normal, no rashes or lesions  Lymph nodes:   Cervical, supraclavicular, and axillary nodes normal  Neurologic:   CNII-XII intact, normal strength, sensation and reflexes    throughout        Assessment:     1. Medicare annual wellness visit, subsequent   2. Routine  screening for STI (sexually transmitted infection)   3. Screening for cardiovascular, respiratory, and genitourinary diseases   4. Screening for colorectal cancer - pt adamantly never wants another endoscopy but agrees to cologuard  5. Screening for deficiency anemia   6. Postmenopausal estrogen deficiency   7. Encounter for screening mammogram for breast cancer - breast exam nml today but pt did have a recent cyst that needs drained so encouraged pt to make appt for screening mammogram asap.  8. Prediabetes - mild but a1c increased from 5.7 to 5.8% this yr  9. Essential hypertension, benign - Will stay off of losartan. Has optho visit 9/19 - if bp elevated then, she will restart losartan 50   10. Medication management   11. Flu vaccine need   12. Primary osteoarthritis of right knee   13. Hypothyroidism, unspecified hypothyroidism type - tsh slightly elev at 5 but t3/t4 on high side of normal and tsh 2 mos prior nml - has been on dose for >4 years so cont and recheck tsh at next routine OV.  14. Hypokalemia   15. Attention deficit hyperactivity disorder (ADHD), predominantly inattentive type - refilled  16.  HPL - ASCVD risk 10.1% so rec pt start pravastatin - will ask if she is ok with this. If start statin and asa risk can lower to 6.8% with ideal risk of 5.9%      Plan:     During the course of the visit the patient was educated and counseled about appropriate screening and preventive services including:    DEXA: 06/2010 nml with lowest T score of -0.8 at left femoral neck  Mam: 10/02/2013 was diagnostic of Left breast - was normal but was recommended to have a bilateral screening exam 11/2013 so pt is >2 yrs overdue for repeat mammography.  Cervical: aged out, did have normal pap with neg HR HPV screen 18 mos prior at 70 yo.  CRS: Dr. Arlyce Dice, had screening colonoscopy that was normal in 2003, scanned in report recommends repeating in 2008, also has Barrett's esophagus.  Pt  has such a horrible experience that she never wants to do another upper or lower endoscopy and refuses referral.  Imm: Pneumovax done 2015, prevnar 2016, zostavax done 2009, tdap done 2011  STI screen - hep c ab screen neg 03/2011 - results seen in paper chart  EKG: Done 08/13/2012  Diet review for nutrition referral? Yes ____  Not Indicated ____  Have pt sign controlled drug contract at next OV. Consider vit D Needs mam and dexa - pt prefers to make her own appt for this Needs f/u with GI for repeat colonosocpy  - pt refuses, willl do cologuard Flu shot  Nodules on her thyroid - all family has hypoactive thyroid - last US done 12/2014 Right nodule was stable along with thyrometgaly not sig changed from 08/2013  Patient Instructions (the written plan) was given to the patient.  Today I have utilized the [place] Controlled Substance Registry's online query to confirm compliance regarding the patient's narcotic pain medications. My review reveals appropriate prescription fills and that [facility] is the sole provider of these medications. Rechecks will occur regularly and the patient is aware of our use of the system.  Medicare Attestation I have personally reviewed: The patient's medical and social history Their use of alcohol, tobacco or illicit drugs Their current medications and supplements The patient's functional ability including ADLs,fall risks, home safety risks, cognitive, and hearing and visual impairment Diet and physical activities Evidence for depression or mood disorders  The patient's weight, height, BMI, and visual acuity have been recorded in the chart.  I have made referrals, counseling, and provided education to the patient based on review of the above and I have provided the patient with a written personalized care plan for preventive services.     Norberto Sorenson, MD   03/02/2016    Orders Placed This Encounter  Procedures  . Flu Vaccine QUAD 36+ mos IM   . Lipid panel    Order Specific Question:   Has the patient fasted?    Answer:   Yes  . Hemoglobin A1c  . Thyroid Panel With TSH  . Cologuard  . CBC  . Comprehensive metabolic panel  . Ambulatory referral to Orthopedic Surgery    Referral Priority:   Routine    Referral Type:   Surgical    Referral Reason:   Specialty Services Required    Requested Specialty:   Orthopedic Surgery    Number of Visits Requested:   1  . POCT urinalysis dipstick    Meds ordered this encounter  Medications  . amphetamine-dextroamphetamine (ADDERALL) 20 MG tablet  Sig: Take 1 tablet (20 mg total) by mouth 2 (two) times daily.    Dispense:  60 tablet    Refill:  0    May fill 60d after date signed  . amphetamine-dextroamphetamine (ADDERALL) 20 MG tablet    Sig: Take 1 tablet (20 mg total) by mouth 2 (two) times daily. For after 30d from signing    Dispense:  60 tablet    Refill:  0  . amphetamine-dextroamphetamine (ADDERALL) 20 MG tablet    Sig: Take 1 tablet (20 mg total) by mouth 2 (two) times daily.    Dispense:  60 tablet    Refill:  0  . ALPRAZolam (XANAX) 0.25 MG tablet    Sig: Take 1 tablet (0.25 mg total) by mouth 2 (two) times daily as needed for anxiety.    Dispense:  30 tablet    Refill:  4  . meloxicam (MOBIC) 15 MG tablet    Sig: Take 1 tablet (15 mg total) by mouth daily.    Dispense:  30 tablet    Refill:  1  . hydrochlorothiazide (HYDRODIURIL) 25 MG tablet    Sig: TAKE ONE TABLET BY MOUTH ONCE DAILY.    Dispense:  90 tablet    Refill:  3    I personally performed the services described in this documentation, which was scribed in my presence. The recorded information has been reviewed and considered, and addended by me as needed.   Norberto SorensonEva Athina Fahey, M.D.  [facility]  [place] [address] [place], [place] [id number] [phone] phone [phone] fax  03/08/16 9:30 AM

## 2016-03-03 ENCOUNTER — Telehealth: Payer: Self-pay | Admitting: *Deleted

## 2016-03-03 ENCOUNTER — Ambulatory Visit (INDEPENDENT_AMBULATORY_CARE_PROVIDER_SITE_OTHER): Payer: Medicare Other | Admitting: Family Medicine

## 2016-03-03 ENCOUNTER — Encounter: Payer: Self-pay | Admitting: Family Medicine

## 2016-03-03 VITALS — BP 122/80 | HR 100 | Temp 98.3°F | Resp 18 | Ht 62.25 in | Wt 252.0 lb

## 2016-03-03 DIAGNOSIS — Z1231 Encounter for screening mammogram for malignant neoplasm of breast: Secondary | ICD-10-CM

## 2016-03-03 DIAGNOSIS — Z130 Encounter for screening for diseases of the blood and blood-forming organs and certain disorders involving the immune mechanism: Secondary | ICD-10-CM

## 2016-03-03 DIAGNOSIS — Z1383 Encounter for screening for respiratory disorder NEC: Secondary | ICD-10-CM | POA: Diagnosis not present

## 2016-03-03 DIAGNOSIS — R7303 Prediabetes: Secondary | ICD-10-CM | POA: Diagnosis not present

## 2016-03-03 DIAGNOSIS — Z780 Asymptomatic menopausal state: Secondary | ICD-10-CM | POA: Diagnosis not present

## 2016-03-03 DIAGNOSIS — R945 Abnormal results of liver function studies: Secondary | ICD-10-CM

## 2016-03-03 DIAGNOSIS — Z1389 Encounter for screening for other disorder: Secondary | ICD-10-CM

## 2016-03-03 DIAGNOSIS — M1711 Unilateral primary osteoarthritis, right knee: Secondary | ICD-10-CM | POA: Diagnosis not present

## 2016-03-03 DIAGNOSIS — Z1211 Encounter for screening for malignant neoplasm of colon: Secondary | ICD-10-CM

## 2016-03-03 DIAGNOSIS — E039 Hypothyroidism, unspecified: Secondary | ICD-10-CM | POA: Diagnosis not present

## 2016-03-03 DIAGNOSIS — E876 Hypokalemia: Secondary | ICD-10-CM

## 2016-03-03 DIAGNOSIS — Z0000 Encounter for general adult medical examination without abnormal findings: Secondary | ICD-10-CM

## 2016-03-03 DIAGNOSIS — Z23 Encounter for immunization: Secondary | ICD-10-CM

## 2016-03-03 DIAGNOSIS — Z79899 Other long term (current) drug therapy: Secondary | ICD-10-CM

## 2016-03-03 DIAGNOSIS — R7989 Other specified abnormal findings of blood chemistry: Secondary | ICD-10-CM

## 2016-03-03 DIAGNOSIS — Z136 Encounter for screening for cardiovascular disorders: Secondary | ICD-10-CM | POA: Diagnosis not present

## 2016-03-03 DIAGNOSIS — Z113 Encounter for screening for infections with a predominantly sexual mode of transmission: Secondary | ICD-10-CM

## 2016-03-03 DIAGNOSIS — I10 Essential (primary) hypertension: Secondary | ICD-10-CM | POA: Diagnosis not present

## 2016-03-03 DIAGNOSIS — E785 Hyperlipidemia, unspecified: Secondary | ICD-10-CM

## 2016-03-03 DIAGNOSIS — F900 Attention-deficit hyperactivity disorder, predominantly inattentive type: Secondary | ICD-10-CM

## 2016-03-03 DIAGNOSIS — Z1212 Encounter for screening for malignant neoplasm of rectum: Secondary | ICD-10-CM

## 2016-03-03 LAB — CBC
HCT: 40.9 % (ref 35.0–45.0)
HEMOGLOBIN: 14.4 g/dL (ref 11.7–15.5)
MCH: 31.6 pg (ref 27.0–33.0)
MCHC: 35.2 g/dL (ref 32.0–36.0)
MCV: 89.7 fL (ref 80.0–100.0)
MPV: 10.1 fL (ref 7.5–12.5)
Platelets: 286 10*3/uL (ref 140–400)
RBC: 4.56 MIL/uL (ref 3.80–5.10)
RDW: 13.4 % (ref 11.0–15.0)
WBC: 7.3 10*3/uL (ref 3.8–10.8)

## 2016-03-03 LAB — LIPID PANEL
Cholesterol: 225 mg/dL — ABNORMAL HIGH (ref 125–200)
HDL: 74 mg/dL (ref >=46)
LDL Cholesterol: 130 mg/dL — ABNORMAL HIGH (ref <130)
TRIGLYCERIDES: 105 mg/dL (ref <150)
Total CHOL/HDL Ratio: 3 Ratio (ref <=5.0)
VLDL: 21 mg/dL (ref <30)

## 2016-03-03 LAB — COMPREHENSIVE METABOLIC PANEL
ALK PHOS: 83 U/L (ref 33–130)
ALT: 37 U/L — AB (ref 6–29)
AST: 41 U/L — AB (ref 10–35)
Albumin: 4.1 g/dL (ref 3.6–5.1)
BILIRUBIN TOTAL: 1.1 mg/dL (ref 0.2–1.2)
BUN: 16 mg/dL (ref 7–25)
CALCIUM: 9.5 mg/dL (ref 8.6–10.4)
CO2: 27 mmol/L (ref 20–31)
Chloride: 98 mmol/L (ref 98–110)
Creat: 0.83 mg/dL (ref 0.50–0.99)
GLUCOSE: 118 mg/dL — AB (ref 65–99)
Potassium: 3.4 mmol/L — ABNORMAL LOW (ref 3.5–5.3)
SODIUM: 135 mmol/L (ref 135–146)
Total Protein: 7.3 g/dL (ref 6.1–8.1)

## 2016-03-03 LAB — THYROID PANEL WITH TSH
Free Thyroxine Index: 3.3 (ref 1.4–3.8)
T3 Uptake: 28 % (ref 22–35)
T4, Total: 11.9 ug/dL (ref 4.5–12.0)
TSH: 5.49 m[IU]/L — AB

## 2016-03-03 MED ORDER — HYDROCHLOROTHIAZIDE 25 MG PO TABS: ORAL_TABLET | ORAL | 3 refills | Status: AC

## 2016-03-03 MED ORDER — AMPHETAMINE-DEXTROAMPHETAMINE 20 MG PO TABS: 20.0000 mg | ORAL_TABLET | Freq: Two times a day (BID) | ORAL | 0 refills | Status: DC

## 2016-03-03 MED ORDER — ALPRAZOLAM 0.25 MG PO TABS: 0.2500 mg | ORAL_TABLET | Freq: Two times a day (BID) | ORAL | 4 refills | Status: DC | PRN

## 2016-03-03 MED ORDER — MELOXICAM 15 MG PO TABS: 15.0000 mg | ORAL_TABLET | Freq: Every day | ORAL | 1 refills | Status: DC

## 2016-03-03 NOTE — Patient Instructions (Addendum)
You need to schedule you mammogram and your bone scan.  Please call the breast center at [facility] at [phone] to schedule    IF you received an x-ray today, you will receive an invoice from [facility]. Please contact [facility] at [phone] with questions or concerns regarding your invoice.   IF you received labwork today, you will receive an invoice from [employer]. Please contact [facility] at [phone] with questions or concerns regarding your invoice.   Our billing staff will not be able to assist you with questions regarding bills from these companies.  You will be contacted with the lab results as soon as they are available. The fastest way to get your results is to activate your My Chart account. Instructions are located on the last page of this paperwork. If you have not heard from [place] regarding the results in 2 weeks, please contact this office.   Eating Healthy on a Budget There are many ways to save money at the grocery store and continue to eat healthy. You can be successful if you plan your meals according to your budget, purchase according to your budget and grocery list, and prepare food yourself.  How can I buy more food on a limited budget? Plan  Plan meals and snacks according to a grocery list and budget you create.  Look for recipes where you can cook once and make enough food for two meals.  Include meals that will "stretch" more expensive foods such as stews, casseroles, and stir-fry dishes.  Make a grocery list and make sure to bring it with you to the store. If you have a smart phone, you could use your phone to create your shopping list. Purchase  When grocery shopping, buy only the items on your grocery list and go only to the areas of the store that have the items on your list. Prepare  Some meal items can be prepared in advance. Pre-cook on days when you have extra time.  Make extra food (such  as by doubling recipes) and freeze the extras in meal-sized containers or in individual portions for fast meals and snacks.  Use leftovers in your meal plan for the week.  Try some meatless meals or try "no cook" meals like salads.  When you come home from the grocery store, wash and prepare your fruits and vegetables so they are ready to use and eat. This will help reduce food waste. How can I buy more food on a limited budget?  Try these tips the next time you go shopping:   [place] store brands or generic brands.  Use coupons only for foods and brands you normally buy. Avoid buying items you wouldn't normally buy simply because they are on sale.  Check online and in newspapers for weekly deals.  Buy healthy items from the bulk bins when available, such as herbs, spices, flours, pastas, nuts, and dried fruit.  Buy fruits and vegetables that are in season. Prices are usually lower on in-season produce.  Compare and contrast different items. You can do this by looking at the unit price on the price tag. Use it to compare different brands and sizes to find out which item is the best deal.  Choose naturally low-cost healthy items, such as carrots, potatoes, apples, bananas, and oranges. Dried or canned beans are a low-cost protein source.  Buy in bulk and freeze extra food. Items you can buy in bulk include meats, fish, poultry, frozen fruits, and frozen vegetables.  Limit the purchase of prepared or "ready-to-eat" foods, such as pre-cut fruits and vegetables and pre-made salads.  If possible, shop around to discover which grocery store offers the best prices. Some stores charge much more than other stores for the same items.  Do not shop when you are hungry. If you shop while hungry, It may be hard to stick to your list and budget.  Stick to your list and resist impulse buys. Treat your list as your official plan for the week.  Buy a variety of vegetables and fruit by purchasing fresh,  frozen, and canned items.  Look beyond eye level. Foods at eye level (adult or child eye level) are more expensive. Look at the top and bottom shelves for deals.  Be efficient with your time when shopping. The more time you spend at the store, the more money you are likely to spend.  Consider other retailers such as dollar stores, larger [employer]olesale stores, local fruit and vegetable stands, and farmers markets. What are some tips for less expensive food substitutions? When choosing more expensive foods like meats and dairy, try these tips to save money:  Choose cheaper cuts of meat, such as bone-in chicken thighs and drumsticks instead skinless and boneless chicken. When you are ready to prepare the chicken, you can remove the skin yourself to make it healthier.  Choose lean meats like chicken or [place]. When choosing ground beef, make sure it is lean ground beef (92% lean, 8% fat). If you do buy a fattier ground beef, drain the fat before eating.  Buy dried beans and peas, such as lentils, split peas, or kidney beans.  For seafood, choose canned tuna, salmon, or sardines.  Eggs are a low-cost source of protein.  Buy the larger tubs of yogurt instead of individual-sized containers.  Choose water instead of sodas and other sweetened beverages.  Skip buying chips, cookies, and other "junk food". These items are usually expensive, high in calories, and low in nutritional value. How can I prepare the foods I buy in the healthiest way? Practice these tips for cooking foods in the healthiest way to reduce excess fat and calorie intake:  Steam, saute, grill, or bake foods instead of frying them.  Make sure half your plate is filled with fruits or vegetables. Choose from fresh, frozen, or canned fruits and vegetables. If eating canned, remember to rinse them before eating. This will remove any excess salt added for packaging.  Trim all fat from meat before cooking. Remove the skin from chicken  or [place].  Spoon off fat from meat dishes once they have been chilled in the refrigerator and the fat has hardened on the top.  Use skim milk, low-fat milk, or evaporated skim milk when making cream sauces, soups, or puddings.  Substitute low-fat yogurt, sour cream, or cottage cheese for sour cream and mayonnaise in dips and dressings.  Try lemon juice, herbs, or spices to season food instead of salt, butter, or margarine.   This information is not intended to replace advice given to you by your health care provider. Make sure you discuss any questions you have with your health care provider.   Document Released: 02/14/2014 Document Reviewed: 02/14/2014 Elsevier Interactive Patient Education 2016 Elsevier Inc.  Potassium Content of Foods Potassium is a mineral found in many foods and drinks. It helps keep fluids and minerals balanced in your body and affects how steadily your heart beats. Potassium also helps control your blood pressure and keep your muscles and  nervous system healthy. Certain health conditions and medicines may change the balance of potassium in your body. When this happens, you can help balance your level of potassium through the foods that you do or do not eat. Your health care provider or dietitian may recommend an amount of potassium that you should have each day. The following lists of foods provide the amount of potassium (in parentheses) per serving in each item. HIGH IN POTASSIUM  The following foods and beverages have 200 mg or more of potassium per serving:  Apricots, 2 raw or 5 dry (200 mg).  Artichoke, 1 medium (345 mg).  Avocado, raw,  each (245 mg).  Banana, 1 medium (425 mg).  Beans, lima, or baked beans, canned,  cup (280 mg).  Beans, white, canned,  cup (595 mg).  Beef roast, 3 oz (320 mg).  Beef, ground, 3 oz (270 mg).  Beets, raw or cooked,  cup (260 mg).  Bran muffin, 2 oz (300 mg).  Broccoli,  cup (230 mg).  Brussels sprouts,   cup (250 mg).  Cantaloupe,  cup (215 mg).  Cereal, 100% bran,  cup (200-400 mg).  Cheeseburger, single, fast food, 1 each (225-400 mg).  Chicken, 3 oz (220 mg).  Clams, canned, 3 oz (535 mg).  Crab, 3 oz (225 mg).  Dates, 5 each (270 mg).  Dried beans and peas,  cup (300-475 mg).  Figs, dried, 2 each (260 mg).  Fish: halibut, tuna, cod, snapper, 3 oz (480 mg).  Fish: salmon, haddock, swordfish, perch, 3 oz (300 mg).  Fish, tuna, canned 3 oz (200 mg).  [place] fries, fast food, 3 oz (470 mg).  Granola with fruit and nuts,  cup (200 mg).  Grapefruit juice,  cup (200 mg).  Greens, beet,  cup (655 mg).  Honeydew melon,  cup (200 mg).  Kale, raw, 1 cup (300 mg).  Kiwi, 1 medium (240 mg).  Kohlrabi, rutabaga, parsnips,  cup (280 mg).  Lentils,  cup (365 mg).  Mango, 1 each (325 mg).  Milk, chocolate, 1 cup (420 mg).  Milk: nonfat, low-fat, whole, buttermilk, 1 cup (350-380 mg).  Molasses, 1 Tbsp (295 mg).  Mushrooms,  cup (280) mg.  Nectarine, 1 each (275 mg).  Nuts: almonds, peanuts, hazelnuts, [place], cashew, mixed, 1 oz (200 mg).  Nuts, pistachios, 1 oz (295 mg).  Orange, 1 each (240 mg).  Orange juice,  cup (235 mg).  Papaya, medium,  fruit (390 mg).  Peanut butter, chunky, 2 Tbsp (240 mg).  Peanut butter, smooth, 2 Tbsp (210 mg).  Pear, 1 medium (200 mg).  Pomegranate, 1 whole (400 mg).  Pomegranate juice,  cup (215 mg).  Pork, 3 oz (350 mg).  Potato chips, salted, 1 oz (465 mg).  Potato, baked with skin, 1 medium (925 mg).  Potatoes, boiled,  cup (255 mg).  Potatoes, mashed,  cup (330 mg).  Prune juice,  cup (370 mg).  Prunes, 5 each (305 mg).  Pudding, chocolate,  cup (230 mg).  Pumpkin, canned,  cup (250 mg).  Raisins, seedless,  cup (270 mg).  Seeds, sunflower or pumpkin, 1 oz (240 mg).  Soy milk, 1 cup (300 mg).  Spinach,  cup (420 mg).  Spinach, canned,  cup (370 mg).  Sweet potato, baked  with skin, 1 medium (450 mg).  Swiss chard,  cup (480 mg).  Tomato or vegetable juice,  cup (275 mg).  Tomato sauce or puree,  cup (400-550 mg).  Tomato, raw, 1 medium (290 mg).  Tomatoes, canned,  cup (200-300 mg).  [place], 3 oz (250 mg).  Wheat germ, 1 oz (250 mg).  Winter squash,  cup (250 mg).  Yogurt, plain or fruited, 6 oz (260-435 mg).  Zucchini,  cup (220 mg). MODERATE IN POTASSIUM The following foods and beverages have 50-200 mg of potassium per serving:  Apple, 1 each (150 mg).  Apple juice,  cup (150 mg).  Applesauce,  cup (90 mg).  Apricot nectar,  cup (140 mg).  Asparagus, small spears,  cup or 6 spears (155 mg).  Bagel, cinnamon raisin, 1 each (130 mg).  Bagel, egg or plain, 4 in., 1 each (70 mg).  Beans, green,  cup (90 mg).  Beans, yellow,  cup (190 mg).  Beer, regular, 12 oz (100 mg).  Beets, canned,  cup (125 mg).  Blackberries,  cup (115 mg).  Blueberries,  cup (60 mg).  Bread, whole wheat, 1 slice (70 mg).  Broccoli, raw,  cup (145 mg).  Cabbage,  cup (150 mg).  Carrots, cooked or raw,  cup (180 mg).  Cauliflower, raw,  cup (150 mg).  Celery, raw,  cup (155 mg).  Cereal, bran flakes, cup (120-150 mg).  Cheese, cottage,  cup (110 mg).  Cherries, 10 each (150 mg).  Chocolate, 1 oz bar (165 mg).  Coffee, brewed 6 oz (90 mg).  Corn,  cup or 1 ear (195 mg).  Cucumbers,  cup (80 mg).  Egg, large, 1 each (60 mg).  Eggplant,  cup (60 mg).  Endive, raw, cup (80 mg).  English muffin, 1 each (65 mg).  Fish, orange roughy, 3 oz (150 mg).  Frankfurter, beef or pork, 1 each (75 mg).  Fruit cocktail,  cup (115 mg).  Grape juice,  cup (170 mg).  Grapefruit,  fruit (175 mg).  Grapes,  cup (155 mg).  Greens: kale, turnip, collard,  cup (110-150 mg).  Ice cream or frozen yogurt, chocolate,  cup (175 mg).  Ice cream or frozen yogurt, vanilla,  cup (120-150 mg).  Lemons, limes, 1  each (80 mg).  Lettuce, all types, 1 cup (100 mg).  Mixed vegetables,  cup (150 mg).  Mushrooms, raw,  cup (110 mg).  Nuts: walnuts, pecans, or macadamia, 1 oz (125 mg).  Oatmeal,  cup (80 mg).  Okra,  cup (110 mg).  Onions, raw,  cup (120 mg).  Peach, 1 each (185 mg).  Peaches, canned,  cup (120 mg).  Pears, canned,  cup (120 mg).  Peas, green, frozen,  cup (90 mg).  Peppers, green,  cup (130 mg).  Peppers, red,  cup (160 mg).  Pineapple juice,  cup (165 mg).  Pineapple, fresh or canned,  cup (100 mg).  Plums, 1 each (105 mg).  Pudding, vanilla,  cup (150 mg).  Raspberries,  cup (90 mg).  Rhubarb,  cup (115 mg).  Rice, wild,  cup (80 mg).  Shrimp, 3 oz (155 mg).  Spinach, raw, 1 cup (170 mg).  Strawberries,  cup (125 mg).  Summer squash  cup (175-200 mg).  Swiss chard, raw, 1 cup (135 mg).  Tangerines, 1 each (140 mg).  Tea, brewed, 6 oz (65 mg).  Turnips,  cup (140 mg).  Watermelon,  cup (85 mg).  Wine, red, table, 5 oz (180 mg).  Wine, white, table, 5 oz (100 mg). LOW IN POTASSIUM The following foods and beverages have less than 50 mg of potassium per serving.  Bread, white, 1 slice (30 mg).  Carbonated beverages, 12 oz (less than  5 mg).  Cheese, 1 oz (20-30 mg).  Cranberries,  cup (45 mg).  Cranberry juice cocktail,  cup (20 mg).  Fats and oils, 1 Tbsp (less than 5 mg).  Hummus, 1 Tbsp (32 mg).  Nectar: papaya, mango, or pear,  cup (35 mg).  Rice, white or brown,  cup (50 mg).  Spaghetti or macaroni,  cup cooked (30 mg).  Tortilla, flour or corn, 1 each (50 mg).  Waffle, 4 in., 1 each (50 mg).  Water chestnuts,  cup (40 mg).   This information is not intended to replace advice given to you by your health care provider. Make sure you discuss any questions you have with your health care provider.   Document Released: 01/25/2005 Document Revised: 06/18/2013 Document Reviewed: 05/10/2013 Elsevier  Interactive Patient Education [employer]

## 2016-03-03 NOTE — Telephone Encounter (Signed)
Called patient left message in voice mail  to let her know she needs to return to pick up RXs and AVS. Patient left because she had to go to work.

## 2016-03-04 ENCOUNTER — Telehealth: Payer: Self-pay | Admitting: Emergency Medicine

## 2016-03-04 LAB — HEMOGLOBIN A1C
Hgb A1c MFr Bld: 5.8 % — ABNORMAL HIGH (ref <5.7)
Mean Plasma Glucose: 120 mg/dL

## 2016-03-08 ENCOUNTER — Encounter: Payer: Self-pay | Admitting: Family Medicine

## 2016-03-08 DIAGNOSIS — R7303 Prediabetes: Secondary | ICD-10-CM | POA: Insufficient documentation

## 2016-03-15 DIAGNOSIS — M1711 Unilateral primary osteoarthritis, right knee: Secondary | ICD-10-CM | POA: Diagnosis not present

## 2016-03-15 DIAGNOSIS — H353211 Exudative age-related macular degeneration, right eye, with active choroidal neovascularization: Secondary | ICD-10-CM | POA: Diagnosis not present

## 2016-03-16 ENCOUNTER — Encounter: Payer: Self-pay | Admitting: Family Medicine

## 2016-04-12 DIAGNOSIS — H353211 Exudative age-related macular degeneration, right eye, with active choroidal neovascularization: Secondary | ICD-10-CM | POA: Diagnosis not present

## 2016-04-12 DIAGNOSIS — H353221 Exudative age-related macular degeneration, left eye, with active choroidal neovascularization: Secondary | ICD-10-CM | POA: Diagnosis not present

## 2016-05-17 DIAGNOSIS — H353211 Exudative age-related macular degeneration, right eye, with active choroidal neovascularization: Secondary | ICD-10-CM | POA: Diagnosis not present

## 2016-05-23 ENCOUNTER — Ambulatory Visit (INDEPENDENT_AMBULATORY_CARE_PROVIDER_SITE_OTHER): Payer: Medicare Other | Admitting: Family Medicine

## 2016-05-23 VITALS — BP 130/80 | HR 92 | Temp 98.5°F | Resp 17 | Ht 62.5 in | Wt 254.0 lb

## 2016-05-23 DIAGNOSIS — L309 Dermatitis, unspecified: Secondary | ICD-10-CM

## 2016-05-23 DIAGNOSIS — R21 Rash and other nonspecific skin eruption: Secondary | ICD-10-CM

## 2016-05-23 LAB — POCT SKIN KOH: Skin KOH, POC: NEGATIVE

## 2016-05-23 MED ORDER — BETAMETHASONE DIPROPIONATE 0.05 % EX CREA: TOPICAL_CREAM | Freq: Two times a day (BID) | CUTANEOUS | 0 refills | Status: AC

## 2016-05-23 NOTE — Patient Instructions (Addendum)
Rub in a small amount of the cream to the affected skin twice daily  Return if further problems    IF you received an x-ray today, you will receive an invoice from [facility]. Please contact [facility] at [phone] with questions or concerns regarding your invoice.   IF you received labwork today, you will receive an invoice from [facility]. Please contact [facility] at [phone] with questions or concerns regarding your invoice.   Our billing staff will not be able to assist you with questions regarding bills from these companies.  You will be contacted with the lab results as soon as they are available. The fastest way to get your results is to activate your My Chart account. Instructions are located on the last page of this paperwork. If you have not heard from us regarding the results in 2 weeks, please contact this office.         IF you received an x-ray today, you will receive an invoice from [facility]. Please contact [facility] at [phone] with questions or concerns regarding your invoice.   IF you received labwork today, you will receive an invoice from [facility]. Please contact [facility] at [phone] with questions or concerns regarding your invoice.   Our billing staff will not be able to assist you with questions regarding bills from these companies.  You will be contacted with the lab results as soon as they are available. The fastest way to get your results is to activate your My Chart account. Instructions are located on the last page of this paperwork. If you have not heard from us regarding the results in 2 weeks, please contact this office.     We recommend that you schedule a mammogram for breast cancer screening. Typically, you do not need a referral to do this. Please contact a local imaging center to schedule your mammogram.  [facility] - [phone]  [phone]  *ask for the Radiology Department [facility] - [phone]) [phone] or [phone]) [phone]  [facility] - [phone] [facility] - [phone]) [phone] [facility] - [phone]) [phone]  *ask for the Radiology Department [facility] - [phone]) [phone]  *ask for the Radiology Department [facility] - [phone]) [phone]  *ask for the Mammography Department [facility] - [phone]) [phone] Caron PresumeRuben a small amount of the cream twice daily to affected skin.

## 2016-05-23 NOTE — Progress Notes (Signed)
Patient ID: Christina Hays, female    DOB: 10/16/1945  Age: 70 y.o. MRN: [id number]  Chief Complaint  Patient presents with  . possible shingles    Subjective:   Late October the patient noticed a rash on her left side of her chest wall underneath the bra strap line. The first is she thought it might be an improvement from the bra strap. Subsequently she has noticed some other places crop about in the mid back, left side of the abdomen, right back, etc. These are only mildly itchy, flaky. She has had the shingles vaccine, thought this might be shingles but she wasn't sure.  Current allergies, medications, problem list, past/family and social histories reviewed.  Objective:  BP 130/80 (BP Location: Right Arm, Patient Position: Sitting, Cuff Size: Large)   Pulse 92   Temp 98.5 F (36.9 C) (Oral)   Resp 17   Ht 5' 2.5" (1.588 m)   Wt 254 lb (115.2 kg)   SpO2 95%   BMI 45.72 kg/m   No major acute distress. Irregular splotchy areas of rash in the areas as noted above, up to 3 or 4 inches in diameter in some patches. Skin is very flaky. KOH scraping was taken.  Assessment & Plan:   Assessment: 1. Rash and nonspecific skin eruption   2. Eczema, unspecified type       Plan: Check for fungus.  KOH was negative. Will treat with betamethasone cream  Orders Placed This Encounter  Procedures  . POCT Skin KOH    Meds ordered this encounter  Medications  . aspirin 81 MG chewable tablet    Sig: Chew by mouth daily.  . betamethasone dipropionate (DIPROLENE) 0.05 % cream    Sig: Apply topically 2 (two) times daily.    Dispense:  30 g    Refill:  0         Patient Instructions   Rub in a small amount of the cream to the affected skin twice daily  Return if further problems    IF you received an x-ray today, you will receive an invoice from [facility]. Please contact [facility] at [phone] with questions or concerns regarding your invoice.    IF you received labwork today, you will receive an invoice from [facility]. Please contact [facility] at [phone] with questions or concerns regarding your invoice.   Our billing staff will not be able to assist you with questions regarding bills from these companies.  You will be contacted with the lab results as soon as they are available. The fastest way to get your results is to activate your My Chart account. Instructions are located on the last page of this paperwork. If you have not heard from us regarding the results in 2 weeks, please contact this office.         IF you received an x-ray today, you will receive an invoice from [facility]. Please contact [facility] at [phone] with questions or concerns regarding your invoice.   IF you received labwork today, you will receive an invoice from [facility]. Please contact [facility] at [phone] with questions or concerns regarding your invoice.   Our billing staff will not be able to assist you with questions regarding bills from these companies.  You will be contacted with the lab results as soon as they are available. The fastest way to get your results is to activate your My Chart account. Instructions are located on the last  page of this paperwork. If you have not heard from us regarding the results in 2 weeks, please contact this office.     We recommend that you schedule a mammogram for breast cancer screening. Typically, you do not need a referral to do this. Please contact a local imaging center to schedule your mammogram.  [facility] - [phone]) [phone]  *ask for the Radiology Department [facility] - [phone] or [phone]  [facility] - [phone]) [phone] [facility] - [phone] [facility] - [phone]  *ask for the Radiology Department [facility] - [phone]  *ask for the Radiology Department [facility] - [phone]  *ask for the Mammography Department [facility] - [phone] Caron PresumeRuben a small amount of the cream twice daily to affected skin.    Return if symptoms worsen or fail to improve.   HOPPER,DAVID, MD 05/23/2016

## 2016-06-12 ENCOUNTER — Encounter: Payer: Self-pay | Admitting: Family Medicine

## 2016-06-14 ENCOUNTER — Other Ambulatory Visit: Payer: Self-pay | Admitting: Family Medicine

## 2016-06-16 ENCOUNTER — Telehealth: Payer: Self-pay

## 2016-06-16 MED ORDER — MELOXICAM 15 MG PO TABS: 15.0000 mg | ORAL_TABLET | Freq: Every day | ORAL | 0 refills | Status: DC

## 2016-06-16 NOTE — Telephone Encounter (Signed)
Pt called and asked why her meloxicam RF was denied. She reported to Lupita LeashDonna that she is going to be climbing stairs over the holidays more than normal and really will need this med. Raynelle FanningJulie had denied RF, probably because at 02/2016 PE Dr Clelia CroftShaw had wanted pt to f/up in 3 mos. She was in for a rash in Nov, but not for f/up w/Shaw. I advised pt that I can give her a 1 mos RF per protocol and send a message to Dr Clelia CroftShaw to advise if she can give more RFs or pt needs to f/up first. Pt went ahead and sche an appt w/Shaw for f/up next week.

## 2016-06-21 DIAGNOSIS — M1711 Unilateral primary osteoarthritis, right knee: Secondary | ICD-10-CM | POA: Diagnosis not present

## 2016-06-24 ENCOUNTER — Ambulatory Visit (INDEPENDENT_AMBULATORY_CARE_PROVIDER_SITE_OTHER): Payer: Medicare Other | Admitting: Family Medicine

## 2016-06-24 VITALS — BP 140/82 | HR 94 | Temp 97.4°F | Resp 18 | Ht 62.5 in | Wt 254.0 lb

## 2016-06-24 DIAGNOSIS — E785 Hyperlipidemia, unspecified: Secondary | ICD-10-CM

## 2016-06-24 DIAGNOSIS — F900 Attention-deficit hyperactivity disorder, predominantly inattentive type: Secondary | ICD-10-CM

## 2016-06-24 DIAGNOSIS — E039 Hypothyroidism, unspecified: Secondary | ICD-10-CM | POA: Diagnosis not present

## 2016-06-24 DIAGNOSIS — K449 Diaphragmatic hernia without obstruction or gangrene: Secondary | ICD-10-CM

## 2016-06-24 DIAGNOSIS — R1310 Dysphagia, unspecified: Secondary | ICD-10-CM | POA: Diagnosis not present

## 2016-06-24 DIAGNOSIS — G43A0 Cyclical vomiting, not intractable: Secondary | ICD-10-CM | POA: Diagnosis not present

## 2016-06-24 DIAGNOSIS — R1115 Cyclical vomiting syndrome unrelated to migraine: Secondary | ICD-10-CM

## 2016-06-24 DIAGNOSIS — M1731 Unilateral post-traumatic osteoarthritis, right knee: Secondary | ICD-10-CM | POA: Diagnosis not present

## 2016-06-24 MED ORDER — MELOXICAM 15 MG PO TABS: 15.0000 mg | ORAL_TABLET | Freq: Every day | ORAL | 5 refills | Status: AC | PRN

## 2016-06-24 MED ORDER — ONDANSETRON 8 MG PO TBDP: 8.0000 mg | ORAL_TABLET | Freq: Three times a day (TID) | ORAL | 5 refills | Status: DC | PRN

## 2016-06-24 MED ORDER — AMPHETAMINE-DEXTROAMPHETAMINE 20 MG PO TABS: 20.0000 mg | ORAL_TABLET | Freq: Two times a day (BID) | ORAL | 0 refills | Status: DC

## 2016-06-24 NOTE — Progress Notes (Signed)
By signing my name below, I, Christina Hays, attest that this documentation has been prepared under the direction and in the presence of Christina SorensonEva Shaw, MD.  Electronically Signed: Arvilla MarketMesha Hays, Medical Scribe. 06/24/16. 2:10 PM.  Subjective:    Patient ID: Christina Hays, female    DOB: December 29, 1945, 70 y.o.   MRN: [id number]  HPI Chief Complaint  Patient presents with  . Follow-up    Right knee, had cortisone shot  . Other    Syclicar Vomiting Syndrome    HPI Comments: Christina Hays Hietala is a 70 y.o. female who presents to the [facility] complaining of right knee follow-up. She was noted to have cyclical vomiting syndrome, uses PRN zofran. She had recently been doing well without an episode in a year; which is the longest she has had. She had right knee pain from arthritis. Prev responded well to meloxicam and had requested a referral to Dr. Kristeen Missan Caffrey to consider cortisone injection. With her last labs her TSH as slightly elevated but she had been stable on her levothyroxine 100 mcg on her dose for years, doses unchanged and recheck next visit. Also, she was also recommended to start pravastatin as her ASCVD risk is 10.1%.  Knee Pain: She mentions between her injection and meloxcam her knee injury has improved. Between now and another 6 months she'll get a MRI. She takes ASA daily. Denies thinning hair and brittle nails.  Thyroid: When she drew blood for her physical she missed 14 days of medication, but hasn't missed any doses of her medication since. She declined thyroid blood work recheck and would like to talk about taking pravastatin medication in March. She states her blood work would be bad after Thanksgiving and Christmas. She isn't waking up in the middle of the night with swelling and stiff lower legs.  Cyclic Vomiting Syndrome: First full week to 2nd week of Nov she had "dry heaving" emesis, and diarrhea with cyclic syndrome and she didn't want to put anything  in her stomach. Starting midnight 12/24 she knew she was going to have reoccurring cyclic syndrome by her tell tale signs of belching and having a taste in her mouth. She state it's becoming frightening and would like to get a specialist since she's getting dehydrated during episodes. There are time where she has trouble swallowing and occasionally chokes. Jan 2011 is when her vomiting became, but she had pt stopped having frequent migraines 10 years before that since she started menopause. The only time she gets them is when there is a huge change in weather pressure. She would take topamax for migraine relief.  Patient Active Problem List   Diagnosis Date Noted  . Prediabetes 03/08/2016  . Macular degeneration 04/28/2015  . Barrett's esophagus 09/10/2012  . Thyroid nodule 09/10/2012  . Hyperlipidemia 09/10/2012  . Osteoarthritis 09/10/2012  . BMI 40.0-44.9, adult (HCC) 09/05/2012  . Hypothyroid 07/27/2011  . HTN (hypertension) 07/27/2011  . ADHD (attention deficit hyperactivity disorder) 07/27/2011  . Cyclic vomiting syndrome 07/27/2011   Past Medical History:  Diagnosis Date  . ADD (attention deficit disorder with hyperactivity)   . Allergy   . Barrett's esophagus   . Cholesteatoma of ear   . Depression   . GERD (gastroesophageal reflux disease)   . Hearing loss   . Hypertension   . Hypothyroidism   . Migraine   . Obesity    Past Surgical History:  Procedure Laterality Date  . MIDDLE EAR SURGERY  01/2011  replaced missing malleus  . TYMPANIC MEMBRANE REPAIR  1990   tighting  . WRIST SURGERY     Allergies  Allergen Reactions  . Citalopram Hydrobromide     psychotic  . Codeine     hallucinations  . Penicillins Hives   Prior to Admission medications   Medication Sig Start Date End Date Taking? Authorizing Provider  ALPRAZolam (XANAX) 0.25 MG tablet Take 1 tablet (0.25 mg total) by mouth 2 (two) times daily as needed for anxiety. 03/03/16  Yes Sherren MochaEva N Shaw, MD    amphetamine-dextroamphetamine (ADDERALL) 20 MG tablet Take 1 tablet (20 mg total) by mouth 2 (two) times daily. 03/03/16  Yes Sherren MochaEva N Shaw, MD  aspirin 81 MG chewable tablet Chew by mouth daily.   Yes Historical Provider, MD  betamethasone dipropionate (DIPROLENE) 0.05 % cream Apply topically 2 (two) times daily. 05/23/16  Yes Peyton Najjaravid H Hopper, MD  Cholecalciferol (VITAMIN D3) 2000 UNITS capsule Take 6,000 Units by mouth daily.   Yes Historical Provider, MD  hydrochlorothiazide (HYDRODIURIL) 25 MG tablet TAKE ONE TABLET BY MOUTH ONCE DAILY. 03/03/16  Yes Sherren MochaEva N Shaw, MD  levothyroxine (SYNTHROID, LEVOTHROID) 100 MCG tablet Take 1 tablet (100 mcg total) by mouth daily before breakfast. 12/28/15  Yes Sherren MochaEva N Shaw, MD  meloxicam (MOBIC) 15 MG tablet Take 1 tablet (15 mg total) by mouth daily. 06/16/16  Yes Sherren MochaEva N Shaw, MD  multivitamin-lutein [facility](OCUVITE-LUTEIN) CAPS capsule Take 1 capsule by mouth daily.   Yes Historical Provider, MD  amphetamine-dextroamphetamine (ADDERALL) 20 MG tablet Take 1 tablet (20 mg total) by mouth 2 (two) times daily. For after 30d from signing Patient not taking: Reported on 06/24/2016 03/03/16   Sherren MochaEva N Shaw, MD  amphetamine-dextroamphetamine (ADDERALL) 20 MG tablet Take 1 tablet (20 mg total) by mouth 2 (two) times daily. Patient not taking: Reported on 06/24/2016 03/03/16   Sherren MochaEva N Shaw, MD   Social History   Social History  . Marital status: Single    Spouse name: N/A  . Number of children: 0  . Years of education: N/A   Occupational History  . admin assist [employer]   Social History Main Topics  . Smoking status: Never Smoker  . Smokeless tobacco: Never Used  . Alcohol use Yes     Comment: one glass of wine a month.  . Drug use: No  . Sexual activity: Not on file   Other Topics Concern  . Not on file   Social History Narrative  . No narrative on file   Review of Systems  HENT: Positive for trouble swallowing.   Cardiovascular: Negative for leg swelling.   Gastrointestinal: Positive for diarrhea, nausea and vomiting.  Musculoskeletal: Positive for arthralgias.  Psychiatric/Behavioral: Negative for sleep disturbance.   Objective:  Physical Exam  Constitutional: She appears well-developed and well-nourished. No distress.  HENT:  Head: Normocephalic and atraumatic.  Eyes: Conjunctivae are normal.  Neck: Neck supple.  Cardiovascular: Normal rate, regular rhythm and normal heart sounds.  Exam reveals no gallop and no friction rub.   No murmur heard. Pulmonary/Chest: Effort normal and breath sounds normal. No respiratory distress. She has no wheezes. She has no rales.  Neurological: She is alert.  Skin: Skin is warm and dry.  Psychiatric: She has a normal mood and affect. Her behavior is normal.  Nursing note and vitals reviewed.  BP 140/82   Pulse 94   Temp 97.4 F (36.3 C) (Oral)   Resp 18   Ht 5' 2.5" (  1.588 m)   Wt 254 lb (115.2 kg)   SpO2 97%   BMI 45.72 kg/m  Assessment & Plan:   1. Post-traumatic osteoarthritis of right knee - dependent on daily meloxicam  2. Attention deficit hyperactivity disorder (ADHD), predominantly inattentive type - stable, refilled meds  3. Non-intractable cyclical vomiting with nausea - refer to [facility] - pt would like to perhaps see a sub-specialist with some interest in her condition, recently learned that this may be related to her prior migraine hx.  4. Dysphagia, unspecified type   5. Hiatal hernia - start ppi  6. Hyperlipidemia, unspecified hyperlipidemia type   7. Hypothyroidism, unspecified type     Orders Placed This Encounter  Procedures  . Ambulatory referral to Gastroenterology    Referral Priority:   Routine    Referral Type:   Consultation    Referral Reason:   Specialty Services Required    Number of Visits Requested:   1  . Care order/instruction:    AVS printed - let patient go!    Meds ordered this encounter  Medications  . meloxicam (MOBIC) 15 MG tablet     Sig: Take 1 tablet (15 mg total) by mouth daily as needed (knee pain).    Dispense:  30 tablet    Refill:  5  . DISCONTD: amphetamine-dextroamphetamine (ADDERALL) 20 MG tablet    Sig: Take 1 tablet (20 mg total) by mouth 2 (two) times daily.    Dispense:  60 tablet    Refill:  0    May fill 60d after date signed  . DISCONTD: amphetamine-dextroamphetamine (ADDERALL) 20 MG tablet    Sig: Take 1 tablet (20 mg total) by mouth 2 (two) times daily. For after 30d from signing    Dispense:  60 tablet    Refill:  0  . DISCONTD: amphetamine-dextroamphetamine (ADDERALL) 20 MG tablet    Sig: Take 1 tablet (20 mg total) by mouth 2 (two) times daily.    Dispense:  60 tablet    Refill:  0  . ondansetron (ZOFRAN-ODT) 8 MG disintegrating tablet    Sig: Take 1 tablet (8 mg total) by mouth every 8 (eight) hours as needed for nausea or vomiting.    Dispense:  20 tablet    Refill:  5  . amphetamine-dextroamphetamine (ADDERALL) 20 MG tablet    Sig: Take 1 tablet (20 mg total) by mouth 2 (two) times daily.    Dispense:  60 tablet    Refill:  0  . amphetamine-dextroamphetamine (ADDERALL) 20 MG tablet    Sig: Take 1 tablet (20 mg total) by mouth 2 (two) times daily. For after 30d from signing    Dispense:  60 tablet    Refill:  0  . amphetamine-dextroamphetamine (ADDERALL) 20 MG tablet    Sig: Take 1 tablet (20 mg total) by mouth 2 (two) times daily.    Dispense:  60 tablet    Refill:  0    May fill 60d after date signed    I personally performed the services described in this documentation, which was scribed in my presence. The recorded information has been reviewed and considered, and addended by me as needed.   Christina Sorenson, M.D.  [facility] [address] [place], [place] [address] [phone] phone [phone] fax  06/27/16 3:55 PM

## 2016-06-24 NOTE — Patient Instructions (Addendum)
IF you received an x-ray today, you will receive an invoice from [facility]. Please contact [facility] at [phone] with questions or concerns regarding your invoice.   IF you received labwork today, you will receive an invoice from [facility]. Please contact [facility] at [phone] with questions or concerns regarding your invoice.   Our billing staff will not be able to assist you with questions regarding bills from these companies.  You will be contacted with the lab results as soon as they are available. The fastest way to get your results is to activate your My Chart account. Instructions are located on the last page of this paperwork. If you have not heard from us regarding the results in 2 weeks, please contact this office.     Rehydration Rehydration is the replacement of body fluids and salts and minerals (electrolytes) that are lost during dehydration. Dehydration is when there is not enough fluid or water in the body. This happens when you lose more fluids than you take in. People who are age 69 or older have a higher risk of dehydration than younger adults. Common causes of dehydration include:  Vomiting.  Diarrhea.  Excessive sweating, such as from heat exposure or exercise.  Taking medicines that cause the body to lose excess fluid (diuretics).  Impaired kidney function.  Not drinking enough fluid.  Certain illnesses or infections.  Certain poorly controlled long-term (chronic) illnesses, such as diabetes, heart disease, and kidney disease. Symptoms of mild dehydration may include thirst, dry lips and mouth, dry skin, and dizziness. Symptoms of severe dehydration may include increased heart rate, confusion, fainting, and not urinating. You can rehydrate by drinking certain fluids or getting fluids through an IV tube, as told by your health care provider. What are the risks? Generally, rehydration is safe. However, one problem that can happen  is taking in too much fluid (overhydration). This is rare. If overhydration happens, it can cause an electrolyte imbalance, kidney failure, fluid in the lungs, or a decrease in salt (sodium) levels in the body. How to rehydrate Follow instructions from your health care provider for rehydration. The kind of fluid you should drink and the amount you should drink depend on your condition.  If directed by your health care provider, drink an oral rehydration solution (ORS). This is a drink designed to treat dehydration that is found in pharmacies and retail stores.  Make an ORS by following instructions on the package.  Start by drinking small amounts, about  cup (120 mL) every 5-10 minutes.  Slowly increase how much you drink until you have taken the amount recommended by your health care provider.  Drink enough clear fluids to keep your urine clear or pale yellow. If you were instructed to drink an ORS, finish the ORS first, then start slowly drinking other clear fluids. Drink fluids such as:  Water. Do not drink only water. Doing that can lead to having too little sodium in your body (hyponatremia).  Ice chips.  Fruit juice that you have added water to (diluted juice).  Low-calorie sports drinks.  If you are severely dehydrated, your health care provider may recommend that you receive fluids through an IV tube in the hospital.  Do not take sodium tablets. Doing that can lead to the condition of having too much sodium in your body (hypernatremia). Eating while you rehydrate Follow instructions from your health care provider about what to eat while you rehydrate. Your health care provider may recommend that you  slowly begin eating regular foods in small amounts.  Eat foods that contain a healthy balance of electrolytes, such as bananas, oranges, potatoes, tomatoes, and spinach.  Avoid foods that are greasy or contain a lot of fat or sugar. In some cases, you may get nutrition through a  feeding tube that is passed through your nose and into your stomach (nasogastric tube, or NG tube). This may be done if you have uncontrolled vomiting or diarrhea. Beverages to avoid Certain beverages may make dehydration worse. While you rehydrate, avoid:  Alcohol.  Caffeine.  Drinks that contain a lot of sugar. These include:  High-calorie sports drinks.  Fruit juice that is not diluted.  Soda. Check nutrition labels to see how much sugar or caffeine a beverage contains. Signs of dehydration recovery You may be recovering from dehydration if:  You urinate more often than you did before you started rehydrating.  Your urine is clear or pale yellow.  Your energy level improves.  You vomit less frequently.  You have diarrhea less frequently.  Your appetite improves or returns to normal.  You feel less dizzy or less light-headed.  Your skin tone and color start to look more normal. Contact a health care provider if:  You continue to have symptoms of mild dehydration, such as:  Thirst.  Dry lips.  Slightly dry mouth.  Dry, warm skin.  Dizziness.  You continue to vomit or have diarrhea. Get help right away if:  You have symptoms of dehydration that get worse.  You feel:  Confused.  Weak.  Like you are going to faint.  You have not urinated in 6-8 hours.  You have very dark urine.  You have trouble breathing.  Your heart rate while sitting still is over 100 beats a minute.  You cannot drink fluids without vomiting.  You have vomiting or diarrhea that:  Gets worse.  Does not go away.  You have a fever. This information is not intended to replace advice given to you by your health care provider. Make sure you discuss any questions you have with your health care provider. Document Released: 09/05/2011 Document Revised: 01/01/2016 Document Reviewed: 08/07/2015 Elsevier Interactive Patient Education  2017 [employer]

## 2016-06-27 MED ORDER — PANTOPRAZOLE SODIUM 40 MG PO TBEC: 40.0000 mg | DELAYED_RELEASE_TABLET | Freq: Every day | ORAL | 1 refills | Status: DC

## 2016-07-05 DIAGNOSIS — H353221 Exudative age-related macular degeneration, left eye, with active choroidal neovascularization: Secondary | ICD-10-CM | POA: Diagnosis not present

## 2016-07-26 DIAGNOSIS — H353211 Exudative age-related macular degeneration, right eye, with active choroidal neovascularization: Secondary | ICD-10-CM | POA: Diagnosis not present

## 2016-09-06 DIAGNOSIS — H3581 Retinal edema: Secondary | ICD-10-CM | POA: Diagnosis not present

## 2016-09-06 DIAGNOSIS — H353231 Exudative age-related macular degeneration, bilateral, with active choroidal neovascularization: Secondary | ICD-10-CM | POA: Diagnosis not present

## 2016-10-12 ENCOUNTER — Other Ambulatory Visit: Payer: Self-pay | Admitting: Family Medicine

## 2016-10-12 DIAGNOSIS — K5900 Constipation, unspecified: Secondary | ICD-10-CM | POA: Diagnosis not present

## 2016-10-12 DIAGNOSIS — R1013 Epigastric pain: Secondary | ICD-10-CM | POA: Diagnosis not present

## 2016-10-12 DIAGNOSIS — G43A0 Cyclical vomiting, not intractable: Secondary | ICD-10-CM | POA: Diagnosis not present

## 2016-10-12 DIAGNOSIS — R197 Diarrhea, unspecified: Secondary | ICD-10-CM | POA: Diagnosis not present

## 2016-10-15 NOTE — Telephone Encounter (Signed)
Called to [facility] 

## 2016-10-25 DIAGNOSIS — R197 Diarrhea, unspecified: Secondary | ICD-10-CM | POA: Diagnosis not present

## 2016-10-25 DIAGNOSIS — R143 Flatulence: Secondary | ICD-10-CM | POA: Diagnosis not present

## 2016-10-25 DIAGNOSIS — K5900 Constipation, unspecified: Secondary | ICD-10-CM | POA: Diagnosis not present

## 2016-11-01 DIAGNOSIS — H353221 Exudative age-related macular degeneration, left eye, with active choroidal neovascularization: Secondary | ICD-10-CM | POA: Diagnosis not present

## 2016-11-01 DIAGNOSIS — H3581 Retinal edema: Secondary | ICD-10-CM | POA: Diagnosis not present

## 2016-11-04 DIAGNOSIS — F410 Panic disorder [episodic paroxysmal anxiety] without agoraphobia: Secondary | ICD-10-CM | POA: Diagnosis not present

## 2016-11-04 DIAGNOSIS — Z79899 Other long term (current) drug therapy: Secondary | ICD-10-CM | POA: Diagnosis not present

## 2016-11-04 DIAGNOSIS — Z7689 Persons encountering health services in other specified circumstances: Secondary | ICD-10-CM | POA: Diagnosis not present

## 2016-11-04 DIAGNOSIS — Z880 Allergy status to penicillin: Secondary | ICD-10-CM | POA: Diagnosis not present

## 2016-11-04 DIAGNOSIS — Z881 Allergy status to other antibiotic agents status: Secondary | ICD-10-CM | POA: Diagnosis not present

## 2016-11-04 DIAGNOSIS — I10 Essential (primary) hypertension: Secondary | ICD-10-CM | POA: Diagnosis not present

## 2016-11-04 DIAGNOSIS — Z885 Allergy status to narcotic agent status: Secondary | ICD-10-CM | POA: Diagnosis not present

## 2016-11-04 DIAGNOSIS — E039 Hypothyroidism, unspecified: Secondary | ICD-10-CM | POA: Diagnosis not present

## 2016-11-04 DIAGNOSIS — F988 Other specified behavioral and emotional disorders with onset usually occurring in childhood and adolescence: Secondary | ICD-10-CM | POA: Diagnosis not present

## 2016-11-04 DIAGNOSIS — Z888 Allergy status to other drugs, medicaments and biological substances status: Secondary | ICD-10-CM | POA: Diagnosis not present

## 2016-11-04 DIAGNOSIS — Z7982 Long term (current) use of aspirin: Secondary | ICD-10-CM | POA: Diagnosis not present

## 2016-11-04 DIAGNOSIS — F419 Anxiety disorder, unspecified: Secondary | ICD-10-CM | POA: Diagnosis not present

## 2016-12-06 DIAGNOSIS — H353231 Exudative age-related macular degeneration, bilateral, with active choroidal neovascularization: Secondary | ICD-10-CM | POA: Diagnosis not present

## 2016-12-06 DIAGNOSIS — H353211 Exudative age-related macular degeneration, right eye, with active choroidal neovascularization: Secondary | ICD-10-CM | POA: Diagnosis not present

## 2016-12-20 DIAGNOSIS — H353221 Exudative age-related macular degeneration, left eye, with active choroidal neovascularization: Secondary | ICD-10-CM | POA: Diagnosis not present

## 2016-12-20 DIAGNOSIS — H353231 Exudative age-related macular degeneration, bilateral, with active choroidal neovascularization: Secondary | ICD-10-CM | POA: Diagnosis not present

## 2017-01-16 ENCOUNTER — Other Ambulatory Visit: Payer: Self-pay | Admitting: Family Medicine

## 2017-01-16 NOTE — Telephone Encounter (Signed)
mychart message sent to pt about making an apt °

## 2017-01-17 DIAGNOSIS — E041 Nontoxic single thyroid nodule: Secondary | ICD-10-CM | POA: Diagnosis not present

## 2017-01-17 DIAGNOSIS — E038 Other specified hypothyroidism: Secondary | ICD-10-CM | POA: Diagnosis not present

## 2017-01-17 DIAGNOSIS — Z79899 Other long term (current) drug therapy: Secondary | ICD-10-CM | POA: Diagnosis not present

## 2017-01-17 DIAGNOSIS — L02411 Cutaneous abscess of right axilla: Secondary | ICD-10-CM | POA: Diagnosis not present

## 2017-01-31 DIAGNOSIS — H353231 Exudative age-related macular degeneration, bilateral, with active choroidal neovascularization: Secondary | ICD-10-CM | POA: Diagnosis not present

## 2017-01-31 DIAGNOSIS — H353211 Exudative age-related macular degeneration, right eye, with active choroidal neovascularization: Secondary | ICD-10-CM | POA: Diagnosis not present

## 2017-02-21 IMAGING — DX DG KNEE 1-2V*R*
2 series · 2 of 2 positions shown · non-contrast
Comparison: None.

CLINICAL DATA: Right knee pain and swelling for 3 months.

EXAM:
RIGHT KNEE - 1-2 VIEW

[knee ap]
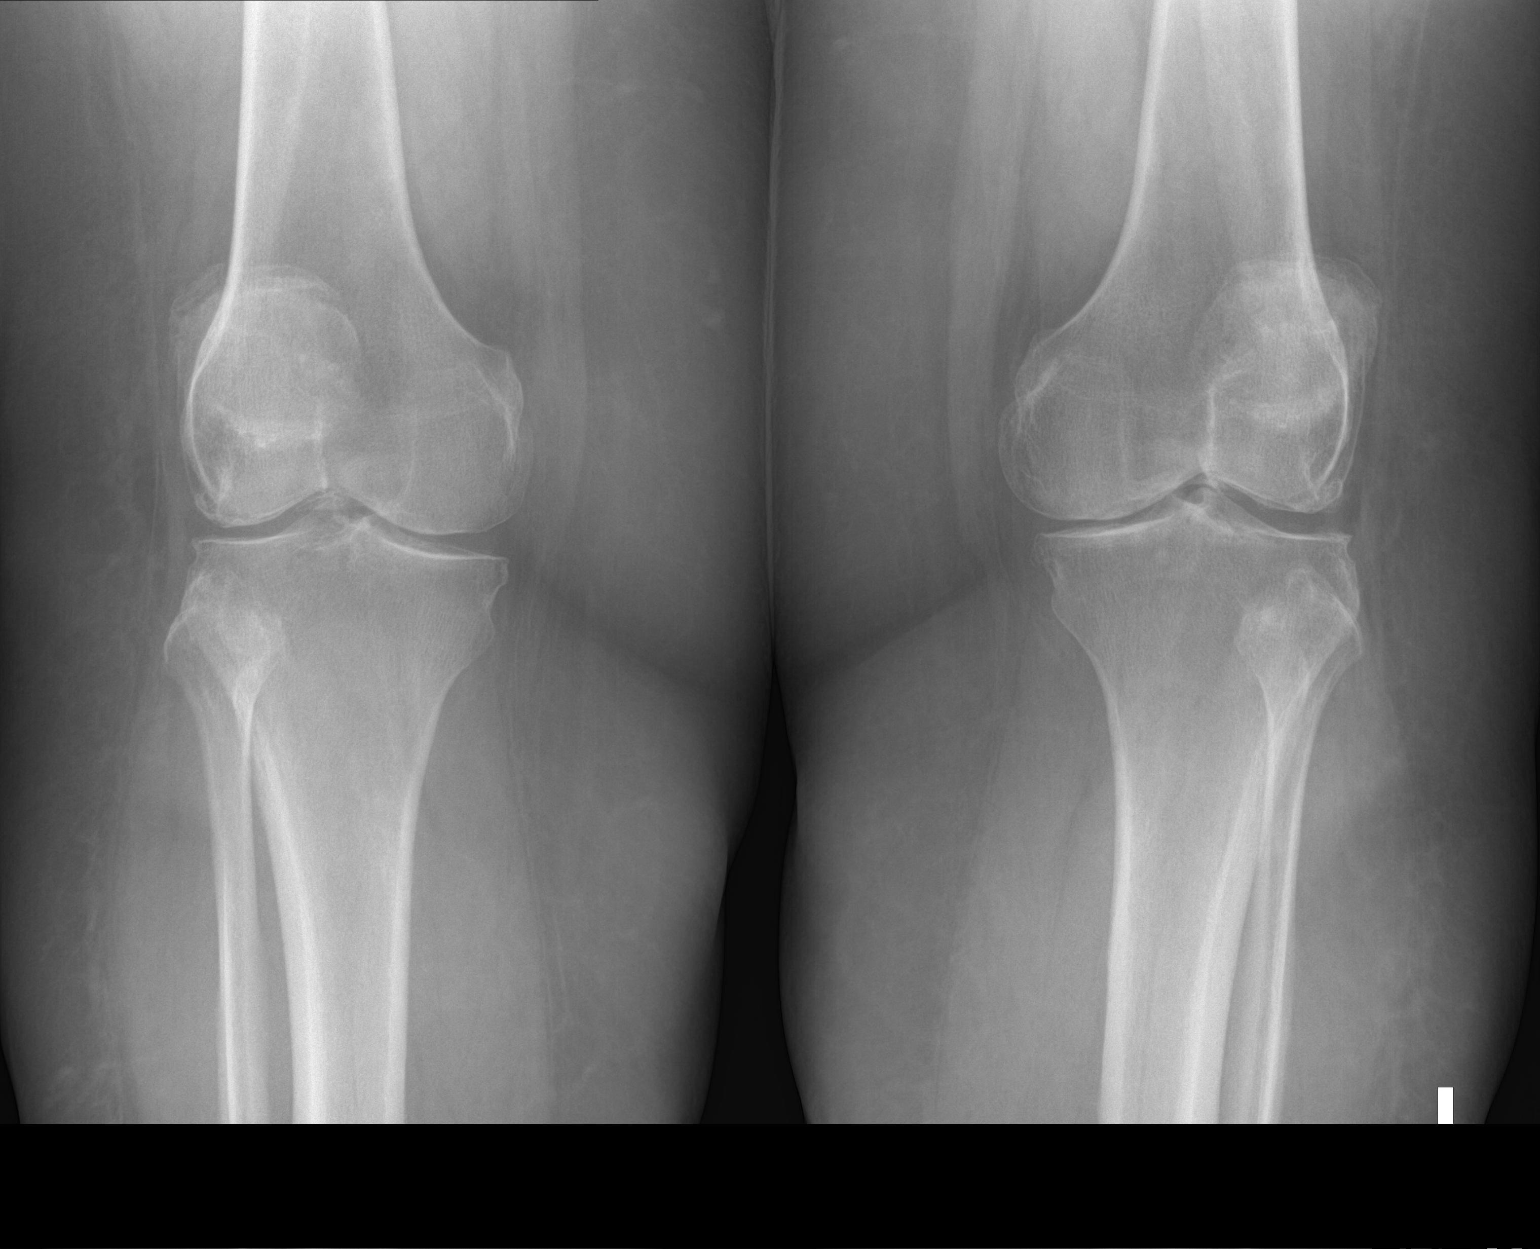

[knee lat]
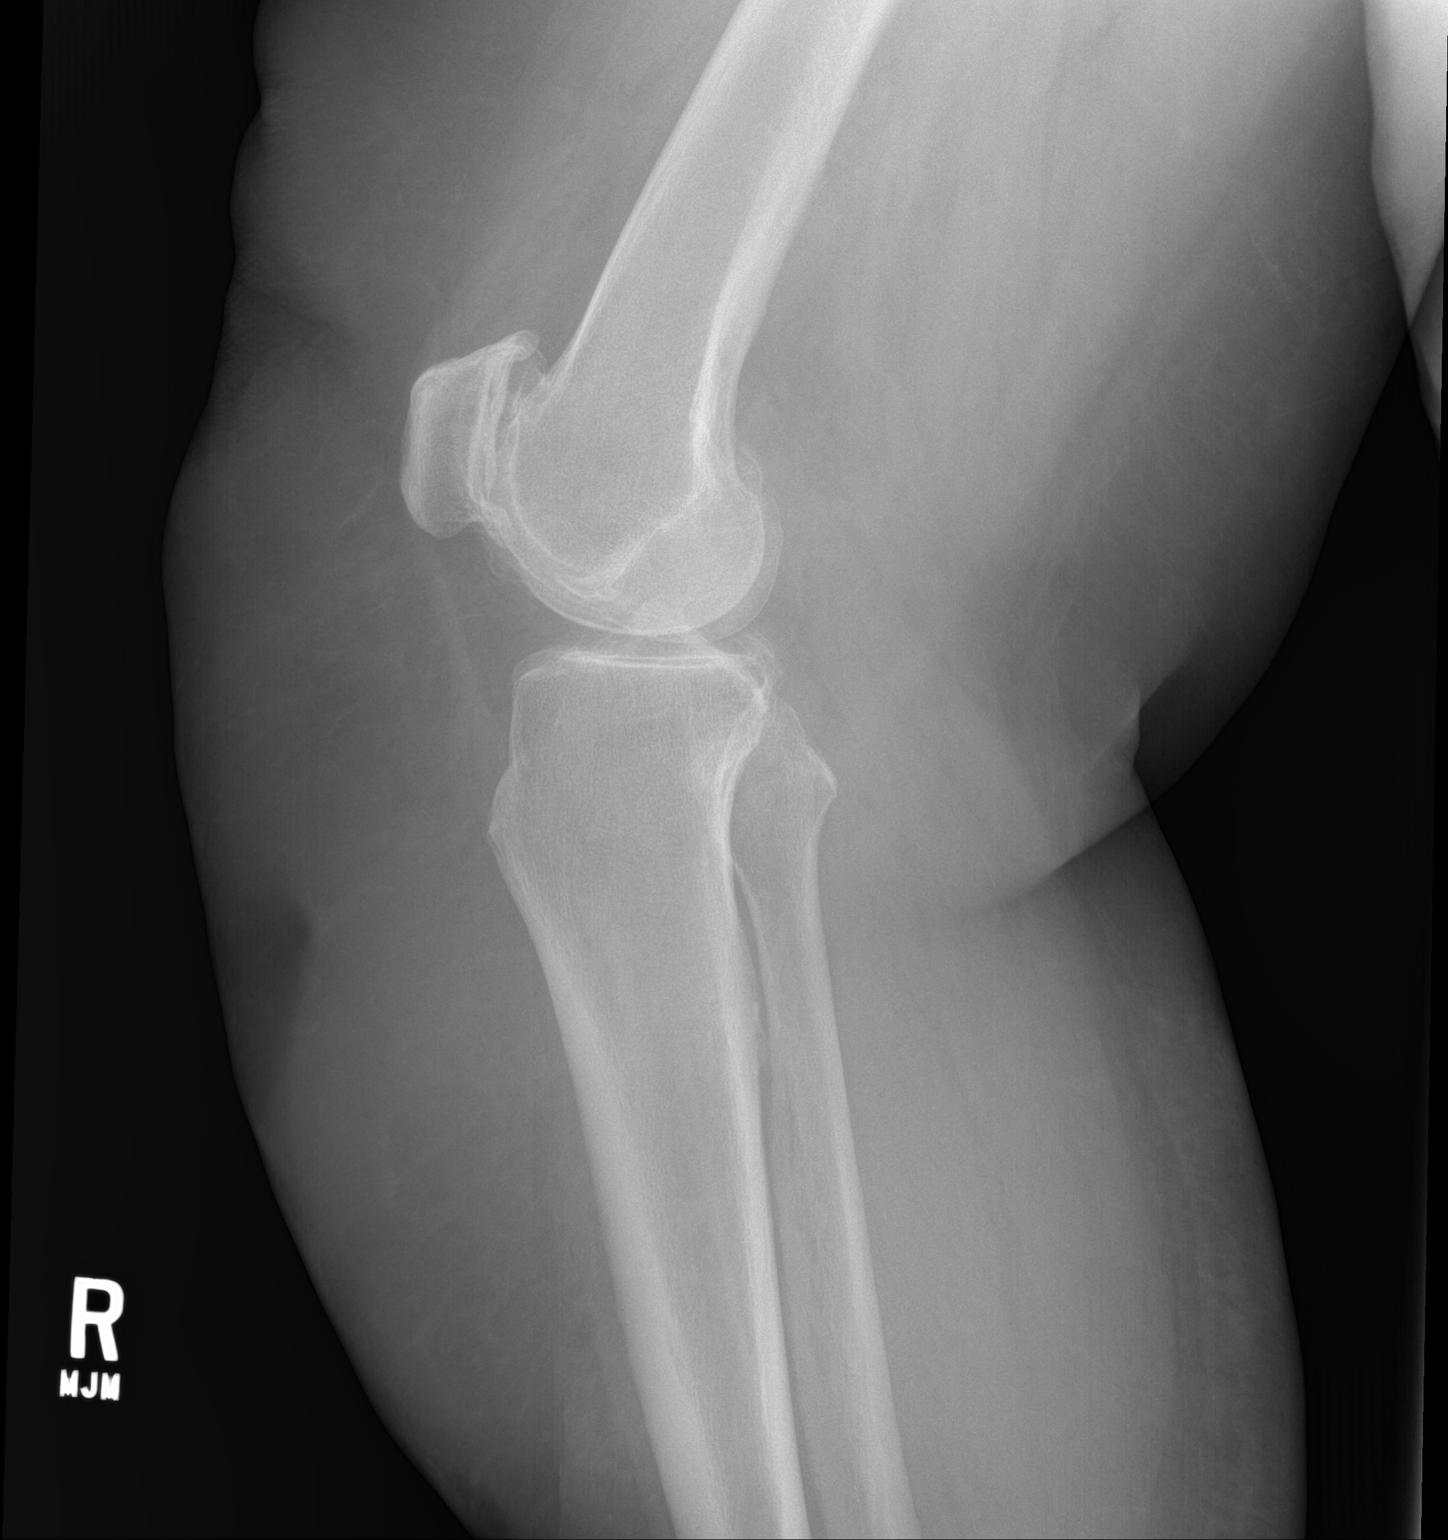

[2 of 2 positions shown; findings below may reference images not displayed]

FINDINGS: Knee osteoarthritis with joint narrowing and spurring greatest at
the patellofemoral compartment. AP imaging on the left shows medial
compartment narrowing. No fracture deformity, subluxation, joint
effusion, or evidence of focal bone lesion.
IMPRESSION: Tricompartmental osteoarthritis most advanced at the patellofemoral
compartment.

## 2017-03-08 ENCOUNTER — Emergency Department (HOSPITAL_COMMUNITY)
Admission: EM | Admit: 2017-03-08 | Discharge: 2017-03-09 | Disposition: A | Discharge status: Home / Self Care | Payer: Medicare Other | Attending: Emergency Medicine | Admitting: Emergency Medicine

## 2017-03-08 ENCOUNTER — Encounter (HOSPITAL_COMMUNITY): Payer: Self-pay

## 2017-03-08 DIAGNOSIS — Z7982 Long term (current) use of aspirin: Secondary | ICD-10-CM | POA: Diagnosis not present

## 2017-03-08 DIAGNOSIS — I10 Essential (primary) hypertension: Secondary | ICD-10-CM | POA: Insufficient documentation

## 2017-03-08 DIAGNOSIS — E039 Hypothyroidism, unspecified: Secondary | ICD-10-CM | POA: Diagnosis not present

## 2017-03-08 DIAGNOSIS — F4323 Adjustment disorder with mixed anxiety and depressed mood: Secondary | ICD-10-CM | POA: Diagnosis not present

## 2017-03-08 DIAGNOSIS — R45851 Suicidal ideations: Secondary | ICD-10-CM | POA: Insufficient documentation

## 2017-03-08 DIAGNOSIS — F329 Major depressive disorder, single episode, unspecified: Secondary | ICD-10-CM | POA: Diagnosis not present

## 2017-03-08 DIAGNOSIS — Z79899 Other long term (current) drug therapy: Secondary | ICD-10-CM | POA: Diagnosis not present

## 2017-03-08 LAB — COMPREHENSIVE METABOLIC PANEL
ALBUMIN: 4.1 g/dL (ref 3.5–5.0)
ALT: 43 U/L (ref 14–54)
AST: 46 U/L — AB (ref 15–41)
Alkaline Phosphatase: 98 U/L (ref 38–126)
Anion gap: 13 (ref 5–15)
BUN: 8 mg/dL (ref 6–20)
CHLORIDE: 101 mmol/L (ref 101–111)
CO2: 25 mmol/L (ref 22–32)
Calcium: 9.4 mg/dL (ref 8.9–10.3)
Creatinine, Ser: 0.83 mg/dL (ref 0.44–1.00)
GFR calc Af Amer: >60 mL/min (ref >60)
GFR calc non Af Amer: >60 mL/min (ref >60)
GLUCOSE: 115 mg/dL — AB (ref 65–99)
POTASSIUM: 3.1 mmol/L — AB (ref 3.5–5.1)
SODIUM: 139 mmol/L (ref 135–145)
Total Bilirubin: 1.1 mg/dL (ref 0.3–1.2)
Total Protein: 8 g/dL (ref 6.5–8.1)

## 2017-03-08 LAB — CBC
HCT: 41.5 % (ref 36.0–46.0)
HEMOGLOBIN: 14.2 g/dL (ref 12.0–15.0)
MCH: 31 pg (ref 26.0–34.0)
MCHC: 34.2 g/dL (ref 30.0–36.0)
MCV: 90.6 fL (ref 78.0–100.0)
Platelets: 278 10*3/uL (ref 150–400)
RBC: 4.58 MIL/uL (ref 3.87–5.11)
RDW: 13.8 % (ref 11.5–15.5)
WBC: 10.5 10*3/uL (ref 4.0–10.5)

## 2017-03-08 LAB — RAPID URINE DRUG SCREEN, HOSP PERFORMED
AMPHETAMINES: NOT DETECTED
Barbiturates: NOT DETECTED
Benzodiazepines: POSITIVE — AB
COCAINE: NOT DETECTED
OPIATES: NOT DETECTED
TETRAHYDROCANNABINOL: NOT DETECTED

## 2017-03-08 LAB — ETHANOL

## 2017-03-08 LAB — SALICYLATE LEVEL: Salicylate Lvl: <7 mg/dL (ref 2.8–30.0)

## 2017-03-08 LAB — ACETAMINOPHEN LEVEL: Acetaminophen (Tylenol), Serum: <10 ug/mL — ABNORMAL LOW (ref 10–30)

## 2017-03-08 MED ORDER — MELOXICAM 15 MG PO TABS
15.0000 mg | ORAL_TABLET | Freq: Every day | ORAL | Status: DC | PRN
  Filled 2017-03-08: qty 1

## 2017-03-08 MED ORDER — ASPIRIN 81 MG PO CHEW
81.0000 mg | CHEWABLE_TABLET | Freq: Every day | ORAL | Status: DC
  Administered 2017-03-09: 81 mg via ORAL
  Filled 2017-03-08: qty 1

## 2017-03-08 MED ORDER — HYDROCHLOROTHIAZIDE 25 MG PO TABS
25.0000 mg | ORAL_TABLET | Freq: Every day | ORAL | Status: DC
  Administered 2017-03-09: 25 mg via ORAL
  Filled 2017-03-08 (×2): qty 1

## 2017-03-08 MED ORDER — PANTOPRAZOLE SODIUM 40 MG PO TBEC
40.0000 mg | DELAYED_RELEASE_TABLET | Freq: Every day | ORAL | Status: DC
  Filled 2017-03-08: qty 1

## 2017-03-08 MED ORDER — AMPHETAMINE-DEXTROAMPHETAMINE 20 MG PO TABS: 20.0000 mg | ORAL_TABLET | Freq: Two times a day (BID) | ORAL | Status: DC

## 2017-03-08 MED ORDER — ALPRAZOLAM 0.25 MG PO TABS: 0.2500 mg | ORAL_TABLET | Freq: Two times a day (BID) | ORAL | Status: DC | PRN

## 2017-03-08 MED ORDER — LEVOTHYROXINE SODIUM 100 MCG PO TABS
100.0000 ug | ORAL_TABLET | Freq: Every day | ORAL | Status: DC
Start: 2017-03-09 — End: 2017-03-09
  Administered 2017-03-09: 100 ug via ORAL
  Filled 2017-03-08: qty 1

## 2017-03-08 MED ORDER — PROSIGHT PO TABS
1.0000 | ORAL_TABLET | Freq: Every day | ORAL | Status: DC
  Administered 2017-03-09: 1 via ORAL
  Filled 2017-03-08: qty 1

## 2017-03-08 MED ORDER — ONDANSETRON 8 MG PO TBDP: 8.0000 mg | ORAL_TABLET | Freq: Three times a day (TID) | ORAL | Status: DC | PRN

## 2017-03-08 NOTE — ED Notes (Signed)
TTS assessment in progress. 

## 2017-03-08 NOTE — ED Notes (Signed)
Pt stated "I have friends that would be devastated if I did something.  I don't own a gun but I don't think I'd have the guts to pull the trigger.  I have a part-time job that I will need to tell int he morning that I won't be coming to work.  I guess I've been having panic attacks for a while but just didn't want to admit it to myself.  I'm having to move out of where I am in a week and I've not even started packing yet.  I have a half brother, that if I thought it would be worth it to do DNA testing to prove he wasn't my brother, I'd do it.  No Fergeson ever asks for help."

## 2017-03-08 NOTE — ED Notes (Signed)
Pt is HOH & requested to have her hearing aid boxes.  Boxes provided & remain in the zippered bag; is labeled.

## 2017-03-08 NOTE — ED Provider Notes (Signed)
[facility]-EMERGENCY DEPT Provider Note   CSN: [id number] Arrival date & time: 03/08/17  1332     History   Chief Complaint Chief Complaint  Patient presents with  . Panic Attack  . Suicidal    HPI Christina Hays is a 71 y.o. female.  HPI  Patient presents with concern of anxiousness, depression, possible suicidal ideation. Patient is a very poor historian, tells a very rambling account of her presentation, but it seems as though since the passing of a friend last month she has had worsening depression. Now, she is in the middle of a stressful move, and feels increasingly depressed, suicidal. She does deny physical pain. She notes a history of ADD, depression, states that she takes medication as directed.  Past Medical History:  Diagnosis Date  . ADD (attention deficit disorder with hyperactivity)   . Allergy   . Barrett's esophagus   . Cholesteatoma of ear   . Depression   . GERD (gastroesophageal reflux disease)   . Hearing loss   . Hypertension   . Hypothyroidism   . Migraine   . Obesity     Patient Active Problem List   Diagnosis Date Noted  . Prediabetes 03/08/2016  . Macular degeneration 04/28/2015  . Barrett's esophagus 09/10/2012  . Thyroid nodule 09/10/2012  . Hyperlipidemia 09/10/2012  . Osteoarthritis 09/10/2012  . BMI 40.0-44.9, adult (HCC) 09/05/2012  . Hypothyroid 07/27/2011  . HTN (hypertension) 07/27/2011  . ADHD (attention deficit hyperactivity disorder) 07/27/2011  . Cyclic vomiting syndrome 07/27/2011    Past Surgical History:  Procedure Laterality Date  . MIDDLE EAR SURGERY  01/2011   replaced missing malleus  . TYMPANIC MEMBRANE REPAIR  1990   tighting  . WRIST SURGERY      OB History    No data available       Home Medications    Prior to Admission medications   Medication Sig Start Date End Date Taking? Authorizing Provider  ALPRAZolam Prudy Feeler) 0.25 MG tablet TAKE ONE TABLET BY MOUTH TWICE DAILY AS NEEDED FOR ANXIETY  10/14/16  Yes Sherren Mocha, MD  aspirin 81 MG chewable tablet Chew by mouth daily.   Yes [provider]  Cholecalciferol (VITAMIN D3) 2000 UNITS capsule Take 6,000 Units by mouth daily.   Yes [provider]  hydrochlorothiazide (HYDRODIURIL) 25 MG tablet TAKE ONE TABLET BY MOUTH ONCE DAILY. Patient taking differently: Take 25 mg by mouth every morning.  03/03/16  Yes Sherren Mocha, MD  levothyroxine (SYNTHROID, LEVOTHROID) 100 MCG tablet TAKE ONE TABLET BY MOUTH ONCE DAILY BEFORE BREAKFAST Patient taking differently: TAKE ONE TABLET BY MOUTH ONCE DAILY AT BEDTIME 01/16/17  Yes Sherren Mocha, MD  meloxicam (MOBIC) 15 MG tablet Take 1 tablet (15 mg total) by mouth daily as needed (knee pain). 06/24/16  Yes Sherren Mocha, MD  multivitamin-lutein [facility]) CAPS capsule Take 1 capsule by mouth every morning.    Yes [provider]  betamethasone dipropionate (DIPROLENE) 0.05 % cream Apply topically 2 (two) times daily. Patient not taking: Reported on 03/08/2017 05/23/16   Peyton Najjar, MD    Family History Family History  Problem Relation Age of Onset  . Liver cancer Maternal Grandfather   . Colon cancer Maternal Grandmother   . Hypertension Maternal Grandmother   . Thyroid disease Maternal Grandmother   . Colon polyps Mother   . Emphysema Mother   . Thyroid disease Mother   . Emphysema Father   . Stroke Father   .  Asthma Father   . Esophageal cancer Neg Hx   . Stomach cancer Neg Hx   . Rectal cancer Neg Hx     Social History Social History  Substance Use Topics  . Smoking status: Never Smoker  . Smokeless tobacco: Never Used  . Alcohol use Yes     Comment: one glass of wine a month.     Allergies   Citalopram hydrobromide; Codeine; and Penicillins   Review of Systems Review of Systems  Constitutional:       Per HPI, otherwise negative  HENT:       Per HPI, otherwise negative  Respiratory:       Per HPI, otherwise negative  Cardiovascular:        Per HPI, otherwise negative  Gastrointestinal: Negative for vomiting.  Endocrine:       Negative aside from HPI  Genitourinary:       Neg aside from HPI   Musculoskeletal:       Per HPI, otherwise negative  Skin: Negative.   Neurological: Negative for syncope.  Psychiatric/Behavioral: Positive for decreased concentration and suicidal ideas.     Physical Exam Updated Vital Signs BP (!) 166/100 (BP Location: Left Arm)   Pulse 81   Temp 98.3 F (36.8 C) (Oral)   Resp 16   Ht 5' 2.5" (1.588 m)   Wt 111.1 kg (245 lb)   SpO2 99%   BMI 44.10 kg/m   Physical Exam  Constitutional: She is oriented to person, place, and time. She appears well-developed and well-nourished. No distress.  HENT:  Head: Normocephalic and atraumatic.  Eyes: Conjunctivae and EOM are normal.  Cardiovascular: Normal rate and regular rhythm.   Pulmonary/Chest: Effort normal and breath sounds normal. No stridor. No respiratory distress.  Abdominal: She exhibits no distension.  Musculoskeletal: She exhibits no edema.  Neurological: She is alert and oriented to person, place, and time. No cranial nerve deficit.  Skin: Skin is warm and dry.  Psychiatric: Her mood appears anxious. Thought content is not paranoid and not delusional. Cognition and memory are not impaired. She exhibits a depressed mood. She expresses suicidal ideation.  Nursing note and vitals reviewed.    ED Treatments / Results  Labs (all labs ordered are listed, but only abnormal results are displayed) Labs Reviewed  COMPREHENSIVE METABOLIC PANEL - Abnormal; Notable for the following:       Result Value   Potassium 3.1 (*)    Glucose, Bld 115 (*)    AST 46 (*)    All other components within normal limits  ACETAMINOPHEN LEVEL - Abnormal; Notable for the following:    Acetaminophen (Tylenol), Serum <10 (*)    All other components within normal limits  RAPID URINE DRUG SCREEN, HOSP PERFORMED - Abnormal; Notable for the following:     Benzodiazepines POSITIVE (*)    All other components within normal limits  ETHANOL  SALICYLATE LEVEL  CBC    Procedures Procedures (including critical care time)  Medications Ordered in ED Medications  ALPRAZolam (XANAX) tablet 0.25 mg (not administered)  amphetamine-dextroamphetamine (ADDERALL) tablet 20 mg (20 mg Oral Refused 03/08/17 2140)  aspirin chewable tablet 81 mg (81 mg Oral Refused 03/08/17 2141)  hydrochlorothiazide (HYDRODIURIL) tablet 25 mg (25 mg Oral Refused 03/08/17 2144)  levothyroxine (SYNTHROID, LEVOTHROID) tablet 100 mcg (not administered)  meloxicam (MOBIC) tablet 15 mg (not administered)  multivitamin (PROSIGHT) tablet 1 tablet (1 tablet Oral Refused 03/08/17 2144)  ondansetron (ZOFRAN-ODT) disintegrating tablet 8 mg (not administered)  pantoprazole (PROTONIX) EC tablet 40 mg (40 mg Oral Refused 03/08/17 2142)     Initial Impression / Assessment and Plan / ED Course  I have reviewed the triage vital signs and the nursing notes.  Pertinent labs & imaging results that were available during my care of the patient were reviewed by me and considered in my medical decision making (see chart for details).  Elderly female with history of depression presents with suicidal ideation, depression, anxiety. Patient is a very poor historian, but given her psychiatric history, acknowledged increase in life stress, she has increased risk, and she was medically cleared for psychiatric evaluation.  Final Clinical Impressions(s) / ED Diagnoses  Depression Suicidal ideation   Gerhard Munch, MD 03/08/17 2343

## 2017-03-08 NOTE — ED Triage Notes (Signed)
Patient states she has been having panic attacks x 2 weeks. Patient states she has been taking Xanax 0.25 mg at the beginning of an attack, but the anxiety did not improve. Patient states she has been waking up in the middle of the night feeling extremely anxious.

## 2017-03-08 NOTE — BH Assessment (Addendum)
Assessment Note  Christina Hays is an 71 y.o. female.  -Clinician reviewed notes from nurse.  Patient added at the end of triage that she was suicidal and would like to ram her car into a wall, but states she does not think she can do it. Patient then stated that she was about to be evicted from her apartment and can not get packed up because she keeps having panic attacks. Patient is very tearful.  Patient says that she is having a hard time with having to move.  She had 4 moves in 7 years and has been at the current location for five years.  She cannot afford the rent anymore and is going to have to leave.  She says that she has rescheduled movers twice already.  Patient says that when it is time for her to pack things she "freezes and just hits a wall."  Patient has movers coming over tomorrow (09/13) and still does not have anything packed.    In triage today she admitted to depression and a feeling of suicidality with a plan to "run my car into a bridge abutment."  She says she does not think she could actually do it though.   Patient has had three previous suicide attempts.  These attempts were over 30 years ago.  Patient has no HI or A/V hallucinations.  Patient says she has been depressed for awhile.  She had a friend that passed away about a month ago.  Patient has no other family to speak of, mother is deceased.  She has a half brother but rarely talks to him.    Patient rambles somewhat in her answers.  She is pleasant to talk to.  She is very worried about the fact that she has one week to move out.  She is worried because she does not think that she has the time for inpatient care if that is the disposition.  She became worried and tearful too mentioning her two cats.  She said she can have someone come in a feed them once but is doubtful of it after that.  She feels inpatient care would make her stressful situation that much worse.  -Clinician discussed patient care with Donell Sievert, PA who recommends inpatient tx.  Clinician informed nurse Duwaine Maxin of disposition.  Diagnosis: MDD, recurrent severe  Past Medical History:  Past Medical History:  Diagnosis Date  . ADD (attention deficit disorder with hyperactivity)   . Allergy   . Barrett's esophagus   . Cholesteatoma of ear   . Depression   . GERD (gastroesophageal reflux disease)   . Hearing loss   . Hypertension   . Hypothyroidism   . Migraine   . Obesity     Past Surgical History:  Procedure Laterality Date  . MIDDLE EAR SURGERY  01/2011   replaced missing malleus  . TYMPANIC MEMBRANE REPAIR  1990   tighting  . WRIST SURGERY      Family History:  Family History  Problem Relation Age of Onset  . Liver cancer Maternal Grandfather   . Colon cancer Maternal Grandmother   . Hypertension Maternal Grandmother   . Thyroid disease Maternal Grandmother   . Colon polyps Mother   . Emphysema Mother   . Thyroid disease Mother   . Emphysema Father   . Stroke Father   . Asthma Father   . Esophageal cancer Neg Hx   . Stomach cancer Neg Hx   . Rectal cancer Neg Hx  Social History:  reports that she has never smoked. She has never used smokeless tobacco. She reports that she drinks alcohol. She reports that she does not use drugs.  Additional Social History:  Alcohol / Drug Use Pain Medications: See PTA medication list Prescriptions: See PTA medication list Over the Counter: See PTA medication list History of alcohol / drug use?: No history of alcohol / drug abuse  CIWA: CIWA-Ar BP: (!) 166/100 Pulse Rate: 81 COWS:    Allergies:  Allergies  Allergen Reactions  . Citalopram Hydrobromide     psychotic  . Codeine     hallucinations  . Penicillins Hives    Home Medications:  (Not in a hospital admission)  OB/GYN Status:  No LMP recorded. Patient is postmenopausal.  General Assessment Data Location of Assessment: [facility] ED TTS Assessment: In system Is this a Tele or Face-to-Face  Assessment?: Face-to-Face Is this an Initial Assessment or a Re-assessment for this encounter?: Initial Assessment Marital status: Single Is patient pregnant?: No Pregnancy Status: No Living Arrangements: Alone Can pt return to current living arrangement?: No (Pt is leaving her residence for another home.  ) Admission Status: Voluntary Is patient capable of signing voluntary admission?: Yes Referral Source: Self/Family/Friend Insurance type: Medicare     Crisis Care Plan Living Arrangements: Alone Name of Psychiatrist: None Name of Therapist: None  Education Status Is patient currently in school?: No Highest grade of school patient has completed: BA and some post grad work  Risk to self with the past 6 months Suicidal Ideation: Yes-Currently Present Has patient been a risk to self within the past 6 months prior to admission? : Yes Suicidal Intent: No Has patient had any suicidal intent within the past 6 months prior to admission? : Yes Is patient at risk for suicide?: Yes Suicidal Plan?: Yes-Currently Present Has patient had any suicidal plan within the past 6 months prior to admission? : Yes Specify Current Suicidal Plan: Running car into a bridge abuttment Access to Means: Yes Specify Access to Suicidal Means: Has an old car What has been your use of drugs/alcohol within the last 12 months?: Noen Previous Attempts/Gestures: Yes How many times?: 3 Other Self Harm Risks: None Triggers for Past Attempts: Other (Comment) (Depression) Intentional Self Injurious Behavior: None Family Suicide History: No Recent stressful life event(s): Turmoil (Comment) (Pt having to move.) Persecutory voices/beliefs?: No Depression: Yes Depression Symptoms: Despondent, Isolating, Tearfulness, Loss of interest in usual pleasures, Feeling worthless/self pity Substance abuse history and/or treatment for substance abuse?: No Suicide prevention information given to non-admitted patients: Not  applicable  Risk to Others within the past 6 months Homicidal Ideation: No Does patient have any lifetime risk of violence toward others beyond the six months prior to admission? : No Thoughts of Harm to Others: No Current Homicidal Intent: No Current Homicidal Plan: No Access to Homicidal Means: No Identified Victim: No one History of harm to others?: No Assessment of Violence: None Noted Violent Behavior Description: No one Does patient have access to weapons?: No Criminal Charges Pending?: No Does patient have a court date: No Is patient on probation?: No  Psychosis Hallucinations: None noted Delusions: None noted  Mental Status Report Appearance/Hygiene: Unremarkable, In scrubs Eye Contact: Good Motor Activity: Freedom of movement, Unremarkable Speech: Logical/coherent Level of Consciousness: Alert Mood: Depressed, Anxious, Helpless, Sad Affect: Depressed, Anxious Anxiety Level: Panic Attacks Panic attack frequency: Daily lately Most recent panic attack: Today Thought Processes: Coherent, Relevant Judgement: Unimpaired Orientation: Person, Place, Situation, Time Obsessive Compulsive  Thoughts/Behaviors: None  Cognitive Functioning Concentration: Decreased Memory: Recent Impaired, Remote Intact IQ: Average Insight: Fair Impulse Control: Fair Appetite: Poor Weight Loss:  (Much less over the last couple weeks.) Weight Gain: 0 Sleep: Decreased Total Hours of Sleep:  (5-6 hours over the last few weeks.) Vegetative Symptoms: Decreased grooming  ADLScreening [facility]) Patient's cognitive ability adequate to safely complete daily activities?: Yes Patient able to express need for assistance with ADLs?: Yes Independently performs ADLs?: Yes (appropriate for developmental age)  Prior Inpatient Therapy Prior Inpatient Therapy: Yes Prior Therapy Dates: In the 1980's Prior Therapy Facilty/Provider(s): [facility] and another facility Reason for Treatment:  Depression  Prior Outpatient Therapy Prior Outpatient Therapy: Yes Prior Therapy Dates: For a number of years Prior Therapy Facilty/Provider(s): Can't recall Reason for Treatment: counseling Does patient have an ACCT team?: No Does patient have Intensive In-House Services?  : No Does patient have [facility] services? 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.) Does the patient have difficulty concentrating, remembering, or making decisions?: Yes Patient able to express need for assistance with ADLs?: Yes Does the patient have difficulty dressing or bathing?: No Independently performs ADLs?: Yes (appropriate for developmental age) Does the patient have difficulty walking or climbing stairs?: No Weakness of Legs: Right (Osteoarthritis in right knee.) Weakness of Arms/Hands: None       Abuse/Neglect Assessment (Assessment to be complete while patient is alone) Physical Abuse: Denies Verbal Abuse: Denies Sexual Abuse: Denies Exploitation of patient/patient's resources: Denies Self-Neglect: Denies     Merchant navy officer (For Healthcare) Does Patient Have a Medical Advance Directive?: No Would patient like information on creating a medical advance directive?: No - Patient declined    Additional Information 1:1 In Past 12 Months?: No CIRT Risk: No Elopement Risk: No Does patient have medical clearance?: Yes     Disposition:  Disposition Initial Assessment Completed for this Encounter: Yes Disposition of Patient: Other dispositions Other disposition(s): Other (Comment) (Pt to be reviewed with PA)  On Site Evaluation by:   Reviewed with Physician:     Alexandria Lodge 03/08/2017 11:35 PM

## 2017-03-08 NOTE — ED Triage Notes (Signed)
Patient added at the end of triage that she was suicidal and would like to ram her car into a wall, but states she does not think she can do it. Patient then stated that she was about to be evicted from her apartment and can not get packed up because she keeps having panic attacks. Patient is very tearful.

## 2017-03-08 NOTE — ED Notes (Signed)
TTS assessment continues. 

## 2017-03-09 DIAGNOSIS — F4323 Adjustment disorder with mixed anxiety and depressed mood: Secondary | ICD-10-CM | POA: Diagnosis present

## 2017-03-09 NOTE — ED Notes (Signed)
Patient verbalized understanding of follow up

## 2017-03-09 NOTE — [facility] Suicide Risk Assessment (Signed)
Suicide Risk Assessment  Discharge Assessment   [facility]   Principal Problem: Acute adjustment disorder with mixed anxiety and depressed mood Discharge Diagnoses:  Patient Active Problem List   Diagnosis Date Noted  . Acute adjustment disorder with mixed anxiety and depressed mood [F43.23] 03/09/2017    Priority: High  . Prediabetes [R73.03] 03/08/2016  . Macular degeneration [H35.30] 04/28/2015  . Barrett's esophagus [K22.70] 09/10/2012  . Thyroid nodule [E04.1] 09/10/2012  . Hyperlipidemia [E78.5] 09/10/2012  . Osteoarthritis [M19.90] 09/10/2012  . BMI 40.0-44.9, adult (HCC) [Z68.41] 09/05/2012  . Hypothyroid [E03.9] 07/27/2011  . HTN (hypertension) [I10] 07/27/2011  . ADHD (attention deficit hyperactivity disorder) [F90.9] 07/27/2011  . Cyclic vomiting syndrome [G43.A0] 07/27/2011    Total Time spent with patient: 45 minutes  Musculoskeletal: Strength & Muscle Tone: within normal limits Gait & Station: normal Patient leans: N/A  Psychiatric Specialty Exam:   Blood pressure 138/76, pulse 71, temperature 98.4 F (36.9 C), temperature source Oral, resp. rate 18, height 5' 2.5" (1.588 m), weight 111.1 kg (245 lb), SpO2 97 %.Body mass index is 44.1 kg/m.  General Appearance: Casual  Eye Contact::  Good  Speech:  Normal Rate409  Volume:  Normal  Mood:  Anxious, mild  Affect:  Congruent  Thought Process:  Coherent and Descriptions of Associations: Intact  Orientation:  Full (Time, Place, and Person)  Thought Content:  WDL and Logical  Suicidal Thoughts:  No  Homicidal Thoughts:  No  Memory:  Immediate;   Good Recent;   Good Remote;   Good  Judgement:  Fair  Insight:  Good  Psychomotor Activity:  Normal  Concentration:  Good  Recall:  Good  Fund of Knowledge:Good  Language: Good  Akathisia:  No  Handed:  Right  AIMS (if indicated):     Assets:  Leisure Time Physical Health Resilience Social Support  Sleep:     Cognition: WNL   ADL's:  Intact   Mental Status Per Nursing Assessment::   On Admission:   71 yo female who came to the ED after having an increase in depression and anxiety because of having to move.  This morning on assessment, she has mild anxiety but no suicidal/homicidal ideations, hallucinations, or alcohol/drug abuse.  She reports she got stressed out with having to move but wanted to downsize and move to a cheaper place anyway.  Stable for discharge.  Demographic Factors:  Age 89 or older, Caucasian and Living alone  Loss Factors: NA  Historical Factors: NA  Risk Reduction Factors:   Sense of responsibility to family, Positive social support and Positive therapeutic relationship  Continued Clinical Symptoms:  Anxiety,mild  Cognitive Features That Contribute To Risk:  None    Suicide Risk:  Minimal: No identifiable suicidal ideation.  Patients presenting with no risk factors but with morbid ruminations; may be classified as minimal risk based on the severity of the depressive symptoms    Plan Of Care/Follow-up recommendations:  Activity:  as tolerated Diet:  heart healthy diet  Izear Pine, NP 03/09/2017, 10:17 AM

## 2017-03-09 NOTE — ED Notes (Signed)
Pt to RN desk requesting Kindle.  Informed will speak to Day Consulting civil engineerCharge RN. Spoke with Alaina & informed pt tearful and requesting Kindle, is unable to read books because of her cataracts.  Per Alaina, pt may have Kindle.  Kindle given to pt.

## 2017-03-14 DIAGNOSIS — Z634 Disappearance and death of family member: Secondary | ICD-10-CM | POA: Diagnosis not present

## 2017-03-14 DIAGNOSIS — Z79899 Other long term (current) drug therapy: Secondary | ICD-10-CM | POA: Diagnosis not present

## 2017-03-14 DIAGNOSIS — Z8659 Personal history of other mental and behavioral disorders: Secondary | ICD-10-CM | POA: Diagnosis not present

## 2017-03-14 DIAGNOSIS — F329 Major depressive disorder, single episode, unspecified: Secondary | ICD-10-CM | POA: Diagnosis not present

## 2017-03-14 DIAGNOSIS — F418 Other specified anxiety disorders: Secondary | ICD-10-CM | POA: Diagnosis not present

## 2017-03-14 DIAGNOSIS — I10 Essential (primary) hypertension: Secondary | ICD-10-CM | POA: Diagnosis not present

## 2017-03-14 DIAGNOSIS — F988 Other specified behavioral and emotional disorders with onset usually occurring in childhood and adolescence: Secondary | ICD-10-CM | POA: Diagnosis not present

## 2017-03-28 DIAGNOSIS — H353231 Exudative age-related macular degeneration, bilateral, with active choroidal neovascularization: Secondary | ICD-10-CM | POA: Diagnosis not present

## 2017-03-28 DIAGNOSIS — H353221 Exudative age-related macular degeneration, left eye, with active choroidal neovascularization: Secondary | ICD-10-CM | POA: Diagnosis not present

## 2017-03-29 ENCOUNTER — Telehealth: Payer: Self-pay

## 2017-03-29 NOTE — Telephone Encounter (Signed)
Called pt to schedule Medicare Annual Wellness Visit. -nr    Nicole Shawntia Mangal, B.A.  Care Guide [phone]  

## 2017-03-31 DIAGNOSIS — I10 Essential (primary) hypertension: Secondary | ICD-10-CM | POA: Diagnosis not present

## 2017-03-31 DIAGNOSIS — R634 Abnormal weight loss: Secondary | ICD-10-CM | POA: Diagnosis not present

## 2017-03-31 DIAGNOSIS — F988 Other specified behavioral and emotional disorders with onset usually occurring in childhood and adolescence: Secondary | ICD-10-CM | POA: Diagnosis not present

## 2017-03-31 DIAGNOSIS — Z8659 Personal history of other mental and behavioral disorders: Secondary | ICD-10-CM | POA: Diagnosis not present

## 2017-03-31 DIAGNOSIS — F329 Major depressive disorder, single episode, unspecified: Secondary | ICD-10-CM | POA: Diagnosis not present

## 2017-05-09 DIAGNOSIS — H3581 Retinal edema: Secondary | ICD-10-CM | POA: Diagnosis not present

## 2017-05-09 DIAGNOSIS — H353211 Exudative age-related macular degeneration, right eye, with active choroidal neovascularization: Secondary | ICD-10-CM | POA: Diagnosis not present

## 2017-05-09 DIAGNOSIS — H353221 Exudative age-related macular degeneration, left eye, with active choroidal neovascularization: Secondary | ICD-10-CM | POA: Diagnosis not present

## 2017-05-12 DIAGNOSIS — R5383 Other fatigue: Secondary | ICD-10-CM | POA: Diagnosis not present

## 2017-05-12 DIAGNOSIS — F329 Major depressive disorder, single episode, unspecified: Secondary | ICD-10-CM | POA: Diagnosis not present

## 2017-05-12 DIAGNOSIS — I10 Essential (primary) hypertension: Secondary | ICD-10-CM | POA: Diagnosis not present

## 2017-05-12 DIAGNOSIS — F988 Other specified behavioral and emotional disorders with onset usually occurring in childhood and adolescence: Secondary | ICD-10-CM | POA: Diagnosis not present

## 2017-05-12 DIAGNOSIS — Z23 Encounter for immunization: Secondary | ICD-10-CM | POA: Diagnosis not present

## 2017-05-26 DIAGNOSIS — E041 Nontoxic single thyroid nodule: Secondary | ICD-10-CM | POA: Diagnosis not present

## 2017-06-13 DIAGNOSIS — H353231 Exudative age-related macular degeneration, bilateral, with active choroidal neovascularization: Secondary | ICD-10-CM | POA: Diagnosis not present

## 2017-06-13 DIAGNOSIS — H353221 Exudative age-related macular degeneration, left eye, with active choroidal neovascularization: Secondary | ICD-10-CM | POA: Diagnosis not present

## 2017-06-22 DIAGNOSIS — E041 Nontoxic single thyroid nodule: Secondary | ICD-10-CM | POA: Diagnosis not present

## 2017-06-22 DIAGNOSIS — E039 Hypothyroidism, unspecified: Secondary | ICD-10-CM | POA: Diagnosis not present

## 2017-06-22 DIAGNOSIS — Z79899 Other long term (current) drug therapy: Secondary | ICD-10-CM | POA: Diagnosis not present

## 2017-06-22 DIAGNOSIS — Z881 Allergy status to other antibiotic agents status: Secondary | ICD-10-CM | POA: Diagnosis not present

## 2017-06-22 DIAGNOSIS — Z880 Allergy status to penicillin: Secondary | ICD-10-CM | POA: Diagnosis not present

## 2017-06-22 DIAGNOSIS — Z885 Allergy status to narcotic agent status: Secondary | ICD-10-CM | POA: Diagnosis not present

## 2017-06-22 DIAGNOSIS — Z7982 Long term (current) use of aspirin: Secondary | ICD-10-CM | POA: Diagnosis not present

## 2017-06-22 DIAGNOSIS — Z888 Allergy status to other drugs, medicaments and biological substances status: Secondary | ICD-10-CM | POA: Diagnosis not present

## 2017-06-22 DIAGNOSIS — I10 Essential (primary) hypertension: Secondary | ICD-10-CM | POA: Diagnosis not present

## 2017-06-29 DIAGNOSIS — Z8659 Personal history of other mental and behavioral disorders: Secondary | ICD-10-CM | POA: Diagnosis not present

## 2017-06-29 DIAGNOSIS — F988 Other specified behavioral and emotional disorders with onset usually occurring in childhood and adolescence: Secondary | ICD-10-CM | POA: Diagnosis not present

## 2017-06-29 DIAGNOSIS — I10 Essential (primary) hypertension: Secondary | ICD-10-CM | POA: Diagnosis not present

## 2017-06-29 DIAGNOSIS — E041 Nontoxic single thyroid nodule: Secondary | ICD-10-CM | POA: Diagnosis not present

## 2017-06-29 DIAGNOSIS — F329 Major depressive disorder, single episode, unspecified: Secondary | ICD-10-CM | POA: Diagnosis not present

## 2017-07-11 DIAGNOSIS — H353231 Exudative age-related macular degeneration, bilateral, with active choroidal neovascularization: Secondary | ICD-10-CM | POA: Diagnosis not present

## 2017-07-11 DIAGNOSIS — H353211 Exudative age-related macular degeneration, right eye, with active choroidal neovascularization: Secondary | ICD-10-CM | POA: Diagnosis not present

## 2017-08-11 DIAGNOSIS — H353221 Exudative age-related macular degeneration, left eye, with active choroidal neovascularization: Secondary | ICD-10-CM | POA: Diagnosis not present

## 2017-08-11 DIAGNOSIS — H353231 Exudative age-related macular degeneration, bilateral, with active choroidal neovascularization: Secondary | ICD-10-CM | POA: Diagnosis not present

## 2017-09-12 DIAGNOSIS — H353231 Exudative age-related macular degeneration, bilateral, with active choroidal neovascularization: Secondary | ICD-10-CM | POA: Diagnosis not present

## 2017-09-12 DIAGNOSIS — H353211 Exudative age-related macular degeneration, right eye, with active choroidal neovascularization: Secondary | ICD-10-CM | POA: Diagnosis not present

## 2017-10-17 DIAGNOSIS — E041 Nontoxic single thyroid nodule: Secondary | ICD-10-CM | POA: Diagnosis not present

## 2017-10-17 DIAGNOSIS — E038 Other specified hypothyroidism: Secondary | ICD-10-CM | POA: Diagnosis not present

## 2017-10-17 DIAGNOSIS — Z8349 Family history of other endocrine, nutritional and metabolic diseases: Secondary | ICD-10-CM | POA: Diagnosis not present

## 2017-10-17 DIAGNOSIS — Z79899 Other long term (current) drug therapy: Secondary | ICD-10-CM | POA: Diagnosis not present

## 2017-10-27 DIAGNOSIS — H3581 Retinal edema: Secondary | ICD-10-CM | POA: Diagnosis not present

## 2017-10-27 DIAGNOSIS — H353221 Exudative age-related macular degeneration, left eye, with active choroidal neovascularization: Secondary | ICD-10-CM | POA: Diagnosis not present

## 2017-10-27 DIAGNOSIS — H353211 Exudative age-related macular degeneration, right eye, with active choroidal neovascularization: Secondary | ICD-10-CM | POA: Diagnosis not present

## 2017-11-08 DIAGNOSIS — E041 Nontoxic single thyroid nodule: Secondary | ICD-10-CM | POA: Diagnosis not present

## 2017-12-05 DIAGNOSIS — H3581 Retinal edema: Secondary | ICD-10-CM | POA: Diagnosis not present

## 2017-12-05 DIAGNOSIS — H353211 Exudative age-related macular degeneration, right eye, with active choroidal neovascularization: Secondary | ICD-10-CM | POA: Diagnosis not present

## 2017-12-05 DIAGNOSIS — H353231 Exudative age-related macular degeneration, bilateral, with active choroidal neovascularization: Secondary | ICD-10-CM | POA: Diagnosis not present

## 2017-12-27 DIAGNOSIS — I872 Venous insufficiency (chronic) (peripheral): Secondary | ICD-10-CM | POA: Diagnosis not present

## 2017-12-27 DIAGNOSIS — F329 Major depressive disorder, single episode, unspecified: Secondary | ICD-10-CM | POA: Diagnosis not present

## 2017-12-27 DIAGNOSIS — I10 Essential (primary) hypertension: Secondary | ICD-10-CM | POA: Diagnosis not present

## 2017-12-27 DIAGNOSIS — F419 Anxiety disorder, unspecified: Secondary | ICD-10-CM | POA: Diagnosis not present

## 2017-12-27 DIAGNOSIS — F988 Other specified behavioral and emotional disorders with onset usually occurring in childhood and adolescence: Secondary | ICD-10-CM | POA: Diagnosis not present

## 2017-12-27 DIAGNOSIS — E041 Nontoxic single thyroid nodule: Secondary | ICD-10-CM | POA: Diagnosis not present

## 2018-01-26 DIAGNOSIS — H3581 Retinal edema: Secondary | ICD-10-CM | POA: Diagnosis not present

## 2018-01-26 DIAGNOSIS — H353221 Exudative age-related macular degeneration, left eye, with active choroidal neovascularization: Secondary | ICD-10-CM | POA: Diagnosis not present

## 2018-01-26 DIAGNOSIS — H353231 Exudative age-related macular degeneration, bilateral, with active choroidal neovascularization: Secondary | ICD-10-CM | POA: Diagnosis not present

## 2018-02-27 DIAGNOSIS — I10 Essential (primary) hypertension: Secondary | ICD-10-CM | POA: Diagnosis not present

## 2018-02-27 DIAGNOSIS — M7989 Other specified soft tissue disorders: Secondary | ICD-10-CM | POA: Diagnosis not present

## 2018-02-27 DIAGNOSIS — E038 Other specified hypothyroidism: Secondary | ICD-10-CM | POA: Diagnosis not present

## 2018-03-02 DIAGNOSIS — R600 Localized edema: Secondary | ICD-10-CM | POA: Diagnosis not present

## 2018-03-13 DIAGNOSIS — H353231 Exudative age-related macular degeneration, bilateral, with active choroidal neovascularization: Secondary | ICD-10-CM | POA: Diagnosis not present

## 2018-03-13 DIAGNOSIS — H353221 Exudative age-related macular degeneration, left eye, with active choroidal neovascularization: Secondary | ICD-10-CM | POA: Diagnosis not present

## 2018-03-13 DIAGNOSIS — H3581 Retinal edema: Secondary | ICD-10-CM | POA: Diagnosis not present

## 2018-03-20 DIAGNOSIS — M25521 Pain in right elbow: Secondary | ICD-10-CM | POA: Diagnosis not present

## 2018-03-20 DIAGNOSIS — Z8719 Personal history of other diseases of the digestive system: Secondary | ICD-10-CM | POA: Diagnosis not present

## 2018-03-20 DIAGNOSIS — R1310 Dysphagia, unspecified: Secondary | ICD-10-CM | POA: Diagnosis not present

## 2018-03-20 DIAGNOSIS — Z8659 Personal history of other mental and behavioral disorders: Secondary | ICD-10-CM | POA: Diagnosis not present

## 2018-03-20 DIAGNOSIS — M79601 Pain in right arm: Secondary | ICD-10-CM | POA: Diagnosis not present

## 2018-03-20 DIAGNOSIS — F909 Attention-deficit hyperactivity disorder, unspecified type: Secondary | ICD-10-CM | POA: Diagnosis not present

## 2018-05-15 DIAGNOSIS — H353221 Exudative age-related macular degeneration, left eye, with active choroidal neovascularization: Secondary | ICD-10-CM | POA: Diagnosis not present

## 2018-05-15 DIAGNOSIS — H353231 Exudative age-related macular degeneration, bilateral, with active choroidal neovascularization: Secondary | ICD-10-CM | POA: Diagnosis not present

## 2018-05-18 DIAGNOSIS — E038 Other specified hypothyroidism: Secondary | ICD-10-CM | POA: Diagnosis not present

## 2021-12-27 ENCOUNTER — Other Ambulatory Visit: Payer: Self-pay
